# Patient Record
Sex: Female | Born: 1970 | Race: White | Hispanic: No | Marital: Married | Smoking: Never smoker
Health system: Southern US, Community
[De-identification: ages and names within clinical notes are randomized; demographics above are authoritative.]

## PROBLEM LIST (undated history)

## (undated) DIAGNOSIS — R87619 Unspecified abnormal cytological findings in specimens from cervix uteri: Secondary | ICD-10-CM

## (undated) DIAGNOSIS — F419 Anxiety disorder, unspecified: Secondary | ICD-10-CM

## (undated) HISTORY — DX: Anxiety disorder, unspecified: F41.9

## (undated) HISTORY — PX: INTRAUTERINE DEVICE INSERTION: SHX323

## (undated) HISTORY — DX: Unspecified abnormal cytological findings in specimens from cervix uteri: R87.619

---

## 2006-08-22 ENCOUNTER — Emergency Department (HOSPITAL_COMMUNITY): Admission: EM | Admit: 2006-08-22 | Discharge: 2006-08-22 | Payer: Self-pay | Admitting: Family Medicine

## 2006-09-06 ENCOUNTER — Emergency Department (HOSPITAL_COMMUNITY): Admission: EM | Admit: 2006-09-06 | Discharge: 2006-09-06 | Payer: Self-pay | Admitting: Emergency Medicine

## 2006-10-17 ENCOUNTER — Ambulatory Visit (HOSPITAL_COMMUNITY): Admission: RE | Admit: 2006-10-17 | Discharge: 2006-10-17 | Payer: Self-pay | Admitting: Internal Medicine

## 2006-11-27 ENCOUNTER — Emergency Department (HOSPITAL_COMMUNITY): Admission: EM | Admit: 2006-11-27 | Discharge: 2006-11-27 | Payer: Self-pay | Admitting: Family Medicine

## 2008-04-30 ENCOUNTER — Emergency Department (HOSPITAL_COMMUNITY): Admission: EM | Admit: 2008-04-30 | Discharge: 2008-04-30 | Payer: Self-pay | Admitting: Family Medicine

## 2009-02-10 ENCOUNTER — Emergency Department (HOSPITAL_COMMUNITY): Admission: EM | Admit: 2009-02-10 | Discharge: 2009-02-10 | Payer: Self-pay | Admitting: Family Medicine

## 2009-08-12 ENCOUNTER — Ambulatory Visit (HOSPITAL_COMMUNITY): Admission: RE | Admit: 2009-08-12 | Discharge: 2009-08-12 | Payer: Self-pay | Admitting: Internal Medicine

## 2010-02-25 ENCOUNTER — Ambulatory Visit (HOSPITAL_COMMUNITY): Admission: RE | Admit: 2010-02-25 | Discharge: 2010-02-25 | Payer: Self-pay | Admitting: Internal Medicine

## 2010-03-04 ENCOUNTER — Encounter: Admission: RE | Admit: 2010-03-04 | Discharge: 2010-03-04 | Payer: Self-pay | Admitting: Specialist

## 2010-08-16 ENCOUNTER — Encounter: Admission: RE | Admit: 2010-08-16 | Discharge: 2010-08-16 | Payer: Self-pay | Admitting: Specialist

## 2011-01-02 ENCOUNTER — Encounter: Payer: Self-pay | Admitting: Internal Medicine

## 2011-03-29 ENCOUNTER — Other Ambulatory Visit: Payer: Self-pay | Admitting: Specialist

## 2011-03-29 DIAGNOSIS — Z09 Encounter for follow-up examination after completed treatment for conditions other than malignant neoplasm: Secondary | ICD-10-CM

## 2011-04-05 ENCOUNTER — Ambulatory Visit
Admission: RE | Admit: 2011-04-05 | Discharge: 2011-04-05 | Disposition: A | Discharge status: Home / Self Care | Payer: 59 | Source: Ambulatory Visit | Attending: Specialist | Admitting: Specialist

## 2011-04-05 DIAGNOSIS — Z09 Encounter for follow-up examination after completed treatment for conditions other than malignant neoplasm: Secondary | ICD-10-CM

## 2012-04-03 ENCOUNTER — Other Ambulatory Visit (HOSPITAL_COMMUNITY): Payer: Self-pay | Admitting: Specialist

## 2012-04-03 DIAGNOSIS — Z1231 Encounter for screening mammogram for malignant neoplasm of breast: Secondary | ICD-10-CM

## 2012-05-08 ENCOUNTER — Ambulatory Visit (HOSPITAL_COMMUNITY)
Admission: RE | Admit: 2012-05-08 | Discharge: 2012-05-08 | Disposition: A | Discharge status: Home / Self Care | Payer: 59 | Source: Ambulatory Visit | Attending: Specialist | Admitting: Specialist

## 2012-05-08 DIAGNOSIS — Z1231 Encounter for screening mammogram for malignant neoplasm of breast: Secondary | ICD-10-CM | POA: Insufficient documentation

## 2012-06-21 ENCOUNTER — Ambulatory Visit (INDEPENDENT_AMBULATORY_CARE_PROVIDER_SITE_OTHER): Payer: 59 | Admitting: Family Medicine

## 2012-06-21 ENCOUNTER — Encounter: Payer: Self-pay | Admitting: Family Medicine

## 2012-06-21 VITALS — BP 112/69 | HR 64 | Temp 97.5°F | Ht 68.0 in | Wt 176.0 lb

## 2012-06-21 DIAGNOSIS — S8990XA Unspecified injury of unspecified lower leg, initial encounter: Secondary | ICD-10-CM

## 2012-06-21 DIAGNOSIS — S99929A Unspecified injury of unspecified foot, initial encounter: Secondary | ICD-10-CM

## 2012-06-21 DIAGNOSIS — S99912A Unspecified injury of left ankle, initial encounter: Secondary | ICD-10-CM

## 2012-06-21 NOTE — Patient Instructions (Addendum)
You have an ankle sprain. Ice the area for 15 minutes at a time, 3-4 times a day Take aleve 1-2 tabs twice a day with food for 1 week then as needed. Elevate above the level of your heart when possible ACE wrap if needed for compression, help with swelling. Strengthening exercises 3 sets of 10 most days of the week for up to 6 weeks. If not improving as expected, we may repeat x-rays or consider further testing like an MRI. Follow up with me as needed.

## 2012-06-24 ENCOUNTER — Encounter: Payer: Self-pay | Admitting: Family Medicine

## 2012-06-24 DIAGNOSIS — S99912A Unspecified injury of left ankle, initial encounter: Secondary | ICD-10-CM | POA: Insufficient documentation

## 2012-06-24 NOTE — Progress Notes (Signed)
  Subjective:    Patient ID: Caroline Roberts, female    DOB: 16-Apr-1971, 41 y.o.   MRN: [id number]  PCP: None listed  HPI 41 yo F here for left ankle injury  Patient reports on 7/10 while walking her dog the dog jumped to the side causing her to trip and fall down. She believes she inverted her left ankle with this injury but happened quickly so not sure - just knows left ankle was hurting after fall. Able to bear weight on this - lateral pain primarily though had some achilles, medial pain as well. Has been icing, elevating, compression wrap, taking motrin. Had a slight limp - improved a great deal from date of injury. Remote history of ankle sprains to both ankles.  History reviewed. No pertinent past medical history.  No current outpatient prescriptions on file prior to visit.    History reviewed. No pertinent past surgical history.  No Known Allergies  History   Social History  . Marital Status: Married    Spouse Name: N/A    Number of Children: N/A  . Years of Education: N/A   Occupational History  . Not on file.   Social History Main Topics  . Smoking status: Never Smoker   . Smokeless tobacco: Not on file  . Alcohol Use: Not on file  . Drug Use: Not on file  . Sexually Active: Not on file   Other Topics Concern  . Not on file   Social History Narrative  . No narrative on file    Family History  Problem Relation Age of Onset  . Hypertension Mother   . Diabetes Mother   . Hypertension Father     BP 112/69  Pulse 64  Temp 97.5 F (36.4 C) (Oral)  Ht 5\' 8"  (1.727 m)  Wt 176 lb (79.833 kg)  BMI 26.76 kg/m2 Review of Systems See HPI above.    Objective:   Physical Exam Gen: NAD  L ankle: No gross deformity, mild swelling, no ecchymoses FROM with 5/5 strength all directions. TTP over ATFL.  No malleolar, base 5th, achilles, navicular, fibular head TTP. 1+ talar tilt.  Trace ant drawer. Negative syndesmotic compression. Thompsons test  negative. NV intact distally.     Assessment & Plan:  1. Left ankle injury - 2/2 ankle sprain - much improved at this point.  Shown home strengthening and balance exercises and handout provided.  Ice, elevation, compression as needed.  Activities as tolerated.  See instructions for further.

## 2012-06-24 NOTE — Assessment & Plan Note (Signed)
2/2 ankle sprain - much improved at this point.  Shown home strengthening and balance exercises and handout provided.  Ice, elevation, compression as needed.  Activities as tolerated.  See instructions for further.

## 2015-08-30 ENCOUNTER — Ambulatory Visit (INDEPENDENT_AMBULATORY_CARE_PROVIDER_SITE_OTHER): Payer: BLUE CROSS/BLUE SHIELD | Admitting: Obstetrics and Gynecology

## 2015-08-30 ENCOUNTER — Encounter: Payer: Self-pay | Admitting: Obstetrics and Gynecology

## 2015-08-30 VITALS — BP 130/74 | HR 60 | Resp 16 | Ht 68.25 in | Wt 185.0 lb

## 2015-08-30 DIAGNOSIS — Z308 Encounter for other contraceptive management: Secondary | ICD-10-CM | POA: Diagnosis not present

## 2015-08-30 DIAGNOSIS — Z01419 Encounter for gynecological examination (general) (routine) without abnormal findings: Secondary | ICD-10-CM

## 2015-08-30 MED ORDER — ACYCLOVIR 200 MG PO CAPS: ORAL_CAPSULE | ORAL | Status: DC

## 2015-08-30 NOTE — Progress Notes (Signed)
Patient ID: Caroline Roberts, female   DOB: [birth year], 44 y.o.   MRN: [id number] 44 y.o. G56P2002 Married Caucasian female here for annual exam.   Wants a new Mirena IUD.  Has her second one alerady.   Wants Acyclovir for oral acyclovir.  Wants 200 mg dosage.  Takes 800 mg every 12 hours.  Takes for 24 hours.   Patient's son has regional pain syndrome following an injury. Seeing a specialist in [place]. Some stress in marriage. Working for [employer]. WHNP.  PCP:   Everrett Coombe, MD  No LMP recorded. Patient is not currently having periods (Reason: IUD).          Sexually active: Yes.  husband  The current method of family planning is none.--has Expired Mirena at this time.    Exercising: Yes.    running and walking. Smoker:  no  Health Maintenance: Pap:  2013 Neg:no HR HPV done History of abnormal Pap:  Yes, in her 20's had colposcopy for abnormal pap which showed CIN I but no treatment to cervix. MMG:  07/2015 normal:[facility] per patient. Colonoscopy:  n/a BMD:   n/a  Result  n/a TDaP:  2013   Screening Labs:  Hb today: PCP, Urine today: unable to void   reports that she has never smoked. She does not have any smokeless tobacco history on file. She reports that she does not drink alcohol or use illicit drugs.  Past Medical History  Diagnosis Date  . Anxiety   . 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 No pertinent past surgical history.  Current Outpatient Prescriptions  Medication Sig Dispense Refill  . levonorgestrel (MIRENA) 20 MCG/24HR IUD 1 each by Intrauterine route once.     No current facility-administered medications for this visit.    Family History  Problem Relation Age of Onset  . Hypertension Mother   . Diabetes Mother   . Hypertension Father   . Melanoma Father   . Breast cancer Paternal Grandmother 65  . Hypertension Paternal Grandmother   .  Diabetes Maternal Grandmother   . Hypertension Maternal Grandmother   . Stroke Maternal Grandmother   . Diabetes Maternal Grandfather   . Hypertension Maternal Grandfather   . Stroke Maternal Grandfather   . Hypertension Paternal Grandfather     ROS:  Pertinent items are noted in HPI.  Otherwise, a comprehensive ROS was negative.  Exam:   BP 130/74 mmHg  Pulse 60  Resp 16  Ht 5' 8.25" (1.734 m)  Wt 185 lb (83.915 kg)  BMI 27.91 kg/m2    General appearance: alert, cooperative and appears stated age Head: Normocephalic, without obvious abnormality, atraumatic Neck: no adenopathy, supple, symmetrical, trachea midline and thyroid normal to inspection and palpation Lungs: clear to auscultation bilaterally Breasts: normal appearance, no masses or tenderness, Inspection negative, No nipple retraction or dimpling, No nipple discharge or bleeding, No axillary or supraclavicular adenopathy Heart: regular rate and rhythm Abdomen: soft, non-tender; bowel sounds normal; no masses,  no organomegaly Extremities: extremities normal, atraumatic, no cyanosis or edema Skin: Skin color, texture, turgor normal. No rashes or lesions Lymph nodes: Cervical, supraclavicular, and axillary nodes normal. No abnormal inguinal nodes palpated Neurologic: Grossly normal  Pelvic: External genitalia:  no lesions              Urethra:  normal appearing urethra with no masses, tenderness or lesions  Bartholins and Skenes: normal                 Vagina: normal appearing vagina with normal color and discharge, no lesions              Cervix: no lesions.  IUD strings seen.               Pap taken: Yes.   Bimanual Exam:  Uterus:  normal size, contour, position, consistency, mobility, non-tender              Adnexa: normal adnexa and no mass, fullness, tenderness              Rectovaginal: Yes.  .  Confirms.              Anus:  normal sphincter tone, no lesions  Chaperone was present for  exam.  Assessment:   Well woman visit with normal exam. Expired Mirena IUD.  HSV.  Uses Acyclovir.  Hx CIN I.  Situational stress.   Plan: Yearly mammogram recommended after age 72.  Recommended self breast exam.  Pap and HR HPV as above. Recommendations for Calcium, Vitamin D, regular exercise program including cardiovascular and weight bearing exercise. Labs performed.  No..   See orders. Refills given on medications.  Yes.  .  See orders.  Acyclovir.  Return for IUD removal and reinsertion of Mirena.   Will use paracervical block.  Has done counseling.  Declines this.  Declines medical therapy at this time.  Follow up annually and prn.     After visit summary provided.

## 2015-08-30 NOTE — Patient Instructions (Signed)

## 2015-09-01 LAB — IPS PAP TEST WITH HPV

## 2015-09-22 ENCOUNTER — Telehealth: Payer: Self-pay | Admitting: Obstetrics and Gynecology

## 2015-09-22 NOTE — Telephone Encounter (Signed)
Patient is calling to check the status of her iud precert. Patient did speak  with her insurance. Patient was last seen 08/30/15. Patient said you can leave details on her voicemail. Patient is aware that Kriste BasqueBecky is out of the office this afternoon.

## 2015-09-23 NOTE — Telephone Encounter (Signed)
Return call to patient regarding scheduling.  Appointment scheduled for Monday, 09/27/2015 at 10 AM. Instructed to take Motrin 800 mg 1 hour prior to appointment with food.  Routing to provider for final review. Patient agreeable to disposition. Will close encounter.

## 2015-09-23 NOTE — Telephone Encounter (Signed)
Patient aware of benefit and ready to schedule iud removal and reinsertion. Agreeable to call from nurse to schedule.

## 2015-09-27 ENCOUNTER — Ambulatory Visit (INDEPENDENT_AMBULATORY_CARE_PROVIDER_SITE_OTHER): Payer: BLUE CROSS/BLUE SHIELD | Admitting: Obstetrics and Gynecology

## 2015-09-27 ENCOUNTER — Encounter: Payer: Self-pay | Admitting: Obstetrics and Gynecology

## 2015-09-27 VITALS — BP 118/74 | HR 56 | Ht 68.25 in | Wt 184.6 lb

## 2015-09-27 DIAGNOSIS — Z30433 Encounter for removal and reinsertion of intrauterine contraceptive device: Secondary | ICD-10-CM | POA: Diagnosis not present

## 2015-09-27 DIAGNOSIS — Z308 Encounter for other contraceptive management: Secondary | ICD-10-CM

## 2015-09-27 LAB — POCT URINE PREGNANCY: PREG TEST UR: NEGATIVE

## 2015-09-27 NOTE — Progress Notes (Signed)
Patient ID: Caroline Roberts, female   DOB: 05/11/1971, 44 y.o.   MRN: [id number] GYNECOLOGY  VISIT   HPI: 44 y.o.   Married  Caucasian  female   G2P2002 with Patient's last menstrual period was 08/10/2015 (approximate).   here for  Mirena IUD removal and re-insertion.  IUD expired on August 18.  Has abstained since last office visit.   Twisted her knee getting things out of the attic.  UPT negative today here.  GYNECOLOGIC HISTORY: Patient's last menstrual period was 08/10/2015 (approximate). Contraception:None Menopausal hormone therapy: n/a Last mammogram: 07/2015 normal:[facility] per patient. Last pap smear: 08-30-15 Neg:Neg HR HHPV        OB History    Gravida Para Term Preterm AB TAB SAB Ectopic Multiple Living   Patient Active Problem List   Diagnosis Date Noted  . Left ankle injury 06/24/2012    Past Medical History  Diagnosis Date  . Anxiety   . Abnormal Pap smear of cervix     --in her 20's had colposcopy--CIN I--no treatment    No past surgical history on file.  Current Outpatient Prescriptions  Medication Sig Dispense Refill  . acyclovir (ZOVIRAX) 200 MG capsule Take 4 capsules twice a day for up to 5 days. 30 capsule 2  . levonorgestrel (MIRENA) 20 MCG/24HR IUD 1 each by Intrauterine route once.     No current facility-administered medications for this visit.     ALLERGIES: Review of patient's allergies indicates no known allergies.  Family History  Problem Relation Age of Onset  . Hypertension Mother   . Diabetes Mother   . Hypertension Father   . Melanoma Father   . Breast cancer Paternal Grandmother 39  . Hypertension Paternal Grandmother   . Diabetes Maternal Grandmother   . Hypertension Maternal Grandmother   . Stroke Maternal Grandmother   . Diabetes Maternal Grandfather   . Hypertension Maternal Grandfather   . Stroke Maternal Grandfather   . Hypertension Paternal Grandfather     Social History    Social History  . Marital Status: Married    Spouse Name: N/A  . Number of Children: N/A  . Years of Education: N/A   Occupational History  . Not on file.   Social History Main Topics  . Smoking status: Never Smoker   . Smokeless tobacco: Not on file  . Alcohol Use: No  . Drug Use: No  . Sexual Activity:    Partners: Male    Birth Control/ Protection: None     Comment: expired Mirena 07/2015   Other Topics Concern  . Not on file   Social History Narrative    ROS:  Pertinent items are noted in HPI.  PHYSICAL EXAMINATION:    BP 118/74 mmHg  Pulse 56  Ht 5' 8.25" (1.734 m)  Wt 184 lb 9.6 oz (83.734 kg)  BMI 27.85 kg/m2  LMP 08/10/2015 (Approximate)    General appearance: alert, cooperative and appears stated age   Pelvic: External genitalia:  no lesions              Urethra:  normal appearing urethra with no masses, tenderness or lesions              Bartholins and Skenes: normal                 Vagina: normal appearing vagina with normal color and discharge,  no lesions              Cervix:  IUD strings seen.              Bimanual Exam:  Uterus:  normal size, contour, position, consistency, mobility, non-tender              Adnexa: normal adnexa and no mass, fullness, tenderness        Mirena IUD removal and reinsertion.  IUD Log number [id number], expiration 02/19.  Speculum placed.  Hibiclens prep.   Paracervical block with 10 cc 1% lidocaine - lot [id number], expiration 12/12/15. Old IUD removed - intact, and discarded.  Uterus sounded to 8.5 cm after os finder used.  Some internal os cervical stenosis.  Mirena IUD placed.  Strings trimmed.  No complications. Minimal EBL.   Chaperone was present for exam.  ASSESSMENT  Mirena IUD removal and placement of new Mirena IUD.   PLAN  Mirena IUD brochure and IUD card to patient.  Patient will return for bleeding, pain or any other concern.    Prefers not to schedule a follow up visit.  She is a Set designerWomen's Health  Nurse Practitioner.   After patient went to the waiting room to check out, she indicated she was having cramping.   I did have her return to an exam room to rest on an exam table.  After a brief time, she then felt well enough to leave the office and the nurse discharged her.    An After Visit Summary was printed and given to the patient.

## 2015-09-28 ENCOUNTER — Ambulatory Visit (INDEPENDENT_AMBULATORY_CARE_PROVIDER_SITE_OTHER): Payer: BLUE CROSS/BLUE SHIELD | Admitting: Obstetrics and Gynecology

## 2015-09-28 ENCOUNTER — Ambulatory Visit (INDEPENDENT_AMBULATORY_CARE_PROVIDER_SITE_OTHER): Payer: BLUE CROSS/BLUE SHIELD

## 2015-09-28 ENCOUNTER — Encounter: Payer: Self-pay | Admitting: Obstetrics and Gynecology

## 2015-09-28 ENCOUNTER — Telehealth: Payer: Self-pay | Admitting: Obstetrics and Gynecology

## 2015-09-28 VITALS — BP 112/70 | HR 58 | Temp 98.3°F | Resp 14 | Wt 185.0 lb

## 2015-09-28 DIAGNOSIS — Z30431 Encounter for routine checking of intrauterine contraceptive device: Secondary | ICD-10-CM | POA: Diagnosis not present

## 2015-09-28 DIAGNOSIS — R1032 Left lower quadrant pain: Secondary | ICD-10-CM | POA: Diagnosis not present

## 2015-09-28 NOTE — Telephone Encounter (Signed)
Patient had iud inserted on yesterday and is having a lot of cramping.

## 2015-09-28 NOTE — Patient Instructions (Signed)

## 2015-09-28 NOTE — Telephone Encounter (Signed)
Spoke with patient. Patient had her Mirena removed and replaced yesterday 10/17. States she was having a lot of cramping that was not relieved with alternating motrin and ibuprofen over night. Cramping has resolved. Now has a constant pain on her left side. Patient is concerned as this pain woke her up last night and has not gone away throughout the day. Pain is 5/10. "Dr.Silva told me the IUD she removed was a little embedded and I am not sure if the pain is coming from that or not. I may just worrying myself, but something is not right." Advised Dr.Silva is out of the office today, but that I will speak with the covering provider and return call as she will likely need to be seen and may need a ultrasound. Patient is agreeable.

## 2015-09-28 NOTE — Telephone Encounter (Signed)
Spoke with patient. Advised patient she will need to be seen in office today for further evaluation. Patient is agreeable. Advised she will need to come now to office. Patient is agreeable and can be here by 4 pm. Okay per Dr.Jertson.  Routing to provider for final review. Patient agreeable to disposition. Will close encounter.

## 2015-09-28 NOTE — Telephone Encounter (Signed)
Left message to call Haydan Mansouri at [phone]. 

## 2015-09-28 NOTE — Telephone Encounter (Signed)
Spoke with Dr.Jertson who recommends that patient come in today and she will likely need an ultrasound. Left message for patient to call Kaitlyn at [phone].

## 2015-09-28 NOTE — Progress Notes (Signed)
    S: the patient had a mirena IUD removed yesterday and a new one inserted. She was told her old IUD was embedded. She was crampy yesterday. Woke up at midnight with pain in her LLQ, the pain ranging from a 2-5/10 in severity today, constant, dull. Last motrin was this morning.   Review of Systems  Gastrointestinal: Positive for abdominal pain.       Left sided abdominal pain   All other systems reviewed and are negative.  Past Medical History  Diagnosis Date  . Anxiety   . Abnormal Pap smear of cervix     --in her 20's had colposcopy--CIN I--no treatment   No past surgical history on file.  O: General: alert white female in mild discomfort Blood pressure 112/70, pulse 58, temperature 98.3 F (36.8 C), resp. rate 14, weight 185 lb (83.915 kg), last menstrual period 08/10/2015. Abdomen: soft, not tender, no masses Pelvic: normal external genitalia, normal vaginal mucosa, cervix without lesions, IUD string 3-4 cm. BM: no CMT, uterus is anteverted, mobile, not tender, no adnexal masses or tenderness.   U/S: IUD in place  A/P  1) LLQ abdominal pain, 1 day s/p removal and reinsertion of a mirena IUD. Benign exam, afebrile, IUD in place on ultrasound  Recommend she take ibuprofen around the clock for the next 24-48 hours  Call with any concerns, or if she isn't feeling better  2) IUD check, IUD in place

## 2015-09-29 ENCOUNTER — Telehealth: Payer: Self-pay | Admitting: Obstetrics and Gynecology

## 2015-09-29 ENCOUNTER — Ambulatory Visit (INDEPENDENT_AMBULATORY_CARE_PROVIDER_SITE_OTHER): Payer: BLUE CROSS/BLUE SHIELD | Admitting: Obstetrics and Gynecology

## 2015-09-29 ENCOUNTER — Encounter: Payer: Self-pay | Admitting: Obstetrics and Gynecology

## 2015-09-29 VITALS — BP 120/72 | HR 56 | Ht 68.25 in | Wt 185.0 lb

## 2015-09-29 DIAGNOSIS — Z30432 Encounter for removal of intrauterine contraceptive device: Secondary | ICD-10-CM

## 2015-09-29 NOTE — Telephone Encounter (Signed)
Spoke with patient. Advised patient Dr.Silva can see her today at 1 pm for IUD removal. Per Dr.Silva if she would like to try pain medication to see if that will help relieve her discomfort we can try that first. Patient states "I am conflicted because I want to try to wait it out, but I just feel like something is not right. I do not want to try pain medication. I think I will just come in to have it removed." Appointment scheduled for today at 1 pm with Dr.Silva. Patient is agreeable.  Routing to provider for final review.

## 2015-09-29 NOTE — Progress Notes (Signed)
Patient ID: Caroline Roberts, female   DOB: [birth year], 44 y.o.   MRN: [id number] GYNECOLOGY  VISIT   HPI: 44 y.o.   Married  Caucasian  female   G2P2002 with Patient's last menstrual period was 08/10/2015 (approximate).   here for Mirena IUD removal.  Patient with LLQ discomfort since Mirena inserted on 09-27-15 and wishes to have it removed today. Feels better to press on the left side.  Is not having cramping.  Feels like pain in left lower side. Cannot sleep.   Needs to able to take care of her son.   Had pelvic ultrasound 09/28/15 showing IUD in endometrial canal.  Ovaries normal.   Had one clot after IUD placed.   GYNECOLOGIC HISTORY: Patient's last menstrual period was 08/10/2015 (approximate). Contraception:Mirena IUD inserted 09-27-15 Menopausal hormone therapy: n/a Last mammogram: 07/2015 normal per patient:[facility]. Last pap smear: 08-30-15 Neg:Neg HR HPV        OB History    Gravida Para Term Preterm AB TAB SAB Ectopic Multiple Living   2 2 2       2          Patient Active Problem List   Diagnosis Date Noted  . Left ankle injury 06/24/2012    Past Medical History  Diagnosis Date  . Anxiety   . Abnormal Pap smear of cervix     --in her 20's had colposcopy--CIN I--no treatment    No past surgical history on file.  Current Outpatient Prescriptions  Medication Sig Dispense Refill  . acyclovir (ZOVIRAX) 200 MG capsule Take 4 capsules twice a day for up to 5 days. 30 capsule 2  . levonorgestrel (MIRENA) 20 MCG/24HR IUD 1 each by Intrauterine route once.     No current facility-administered medications for this visit.     ALLERGIES: Review of patient's allergies indicates no known allergies.  Family History  Problem Relation Age of Onset  . Hypertension Mother   . Diabetes Mother   . Hypertension Father   . Melanoma Father   . Breast cancer Paternal Grandmother [id number]  . Hypertension Paternal Grandmother   . Diabetes Maternal Grandmother   .  Hypertension Maternal Grandmother   . Stroke Maternal Grandmother   . Diabetes Maternal Grandfather   . Hypertension Maternal Grandfather   . Stroke Maternal Grandfather   . Hypertension Paternal Grandfather     Social History   Social History  . Marital Status: Married    Spouse Name: N/A  . Number of Children: N/A  . Years of Education: N/A   Occupational History  . Not on file.   Social History Main Topics  . Smoking status: Never Smoker   . Smokeless tobacco: Not on file  . Alcohol Use: No  . Drug Use: No  . Sexual Activity:    Partners: Male    Birth Control/ Protection: None     Comment: expired Mirena 07/2015   Other Topics Concern  . Not on file   Social History Narrative    ROS:  Pertinent items are noted in HPI.  PHYSICAL EXAMINATION:    BP 120/72 mmHg  Pulse 56  Ht 5' 8.25" (1.734 m)  Wt 185 lb (83.915 kg)  BMI 27.91 kg/m2  LMP 08/10/2015 (Approximate)    General appearance: alert, cooperative and appears stated age Abdomen: soft, non-tender; bowel sounds normal; no masses,  no organomegaly   Pelvic: External genitalia:  no lesions  Urethra:  normal appearing urethra with no masses, tenderness or lesions              Bartholins and Skenes: normal                 Vagina: normal appearing vagina with normal color and discharge, no lesions              Cervix: no lesions and IUD strings seen.  IUD removed with ring forceps after verbal consent for IUD removal.   Feels like there is internal os stenosis - some resistance with removal of the IUD as it passes through endocervix.            Bimanual Exam:  Uterus:  normal size, contour, position, consistency, mobility, non-tender              Adnexa: normal adnexa and no mass, fullness, tenderness              Chaperone was present for exam.  ASSESSMENT  Pelvic pain.  Mirena IUD removal.   PLAN  I told patient I was sorry that she was having pain.  Patient declines pain medication.   She declines contraception at this time.  Will call if she changes her mind.  Follow up prn.    An After Visit Summary was printed and given to the patient.

## 2015-09-29 NOTE — Telephone Encounter (Signed)
Patient wants to have IUD removed her IUD has been causing her constant pain. Best contact # for patient [phone]

## 2015-09-29 NOTE — Telephone Encounter (Signed)
Thank you for the note.  I will remove the IUD for the patient if she would like.  Encounter is closed.

## 2015-09-29 NOTE — Telephone Encounter (Signed)
Spoke with patient. Patient had her Mirena removed and replaced on 09/27/2015 with Dr.Silva. Was seen yesterday 09/28/2015 with Dr.Jertson for evaluation and PUS for LLQ pain. IUD was in correct position. Patient calling today stating that with alternating motrin and tylenol she is still experiencing pain of 5/10. "I thought it would subside, but it is not going away." Denies any fevers, chills, or bleeding. Patient is requesting to come in to have IUD removed. Is out of work today due to lack of sleep from pain over night. Advised I will speak with Dr.Silva about her symptoms and removal and return call. Patient is agreeable and is available today to come in.

## 2015-09-29 NOTE — Addendum Note (Signed)
Addended by: Alphonsa OverallIXON, AMANDA L on: 09/29/2015 12:51 PM   Modules accepted: Orders

## 2015-11-17 ENCOUNTER — Telehealth: Payer: Self-pay | Admitting: Obstetrics and Gynecology

## 2015-11-17 NOTE — Telephone Encounter (Signed)
Patient called and said, "I've had some trouble with the Mirena device previously and don't have one right now. 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

## 2015-11-17 NOTE — Telephone Encounter (Signed)
Spoke with patient. 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 "I think I want to give it another try. The rep told me that they are able to send me a replacement IUD so that it will not have to go through my insurance." Patient states the representative's name was Victorino DikeJennifer and she is going to contact her regarding obtaining a "replacement" IUD once she is scheduled. Mirena IUD was removed 09/29/2015 with Dr.Silva due to increased pain and discomfort. Patient states that she had a cycle after removal but has not had a cycle since. Advised patient I will need to speak with Dr.Silva regarding recommendations on insertion as this has to be done with her cycle. Patient is agreeable.

## 2015-11-18 MED ORDER — MISOPROSTOL 200 MCG PO TABS: ORAL_TABLET | ORAL | Status: DC

## 2015-11-18 NOTE — Telephone Encounter (Signed)
Per ROI, ok to leave detailed message on cell number and VM confirms this is "Tresa EndoKelly." Reviewed with Dr Edward JollySilva directly regarding insertion for tomorrow if patient on cycle. She may take Cytotec orally or vaginally, whichever she is more comfortable with.  Left message with Dr Rica RecordsSilva's recommendation for Cytotec in effort to facilitate successful procedure.  Will proceed with sending to pharmacy on file in effort to plan for insertion tomorrow if patient desires. Advised to call in am.

## 2015-11-18 NOTE — Telephone Encounter (Signed)
Call to Mirena rep Chalmers CaterCaroline Roberts [phone]. Replacement IUD will require MD signature first and then be shipped to office. Rep will be by office today or tomorrow for signature.

## 2015-11-18 NOTE — Telephone Encounter (Signed)
Call to patient. Update in IUD replacement provided. Patient using condoms for contraception and menses starting now. Spotting today. Has some concerns about Cytotec. Guideline changes and risk of infection. Discussed benefits of easing dilation for insertion with stenotic cervix but can certainly review with provider.  Will also check on recommendation for insertion date since spotting has started today. Routing to Dr Edward JollySilva for recommendation.

## 2015-11-18 NOTE — Telephone Encounter (Signed)
I am happy to place another Mirena IUD.   I would suggest pretreatment with Cytotec 200 mcg pv the hs prior to procedure and Cytotec 200 mcg pv the am of procedure.  Please send to pharmacy of choice. I would also do a paracervical block for her again.    We will need to do the precert process again for the IUD.  There will be at least a fee for placement of the IUD. We will also need to coordinate with the MIrena rep so the IUD can be delivered here or is the [place] company going to reimburse her?  Please have patient do a urine pregnancy test now if she has not already done this.  Her IUD can be placed if she has not had a cycle and has a negative serum hCG two days prior to placement of IUD and no intercourse for 2 weeks before this........Marland Kitchen.or just place the IUD with the next cycle.

## 2015-11-18 NOTE — Telephone Encounter (Signed)
Call to patient. Left message to call back.  

## 2015-11-18 NOTE — Telephone Encounter (Signed)
Call to patient, left message to call back. 

## 2015-11-18 NOTE — Telephone Encounter (Signed)
I welcome patient to take the Cytotec orally instead of vaginally.   I needed to use the os finder for the insertion that last time, and I would like to do anything I can to make this more comfortable and successful for her!

## 2015-11-19 ENCOUNTER — Ambulatory Visit (INDEPENDENT_AMBULATORY_CARE_PROVIDER_SITE_OTHER): Payer: BLUE CROSS/BLUE SHIELD | Admitting: Obstetrics and Gynecology

## 2015-11-19 ENCOUNTER — Encounter: Payer: Self-pay | Admitting: Obstetrics and Gynecology

## 2015-11-19 VITALS — BP 118/70 | HR 60 | Ht 68.25 in | Wt 187.0 lb

## 2015-11-19 DIAGNOSIS — Z30430 Encounter for insertion of intrauterine contraceptive device: Secondary | ICD-10-CM

## 2015-11-19 LAB — POCT URINE PREGNANCY: Preg Test, Ur: NEGATIVE

## 2015-11-19 NOTE — Telephone Encounter (Signed)
Return call to patient. Left message to call back. Can also speak to triage nurse.

## 2015-11-19 NOTE — Telephone Encounter (Signed)
Routing to provider for final review. Patient agreeable to disposition. Will close encounter.     

## 2015-11-19 NOTE — Telephone Encounter (Signed)
Rx for Cytotec was signed by me just now, so I am not certain the patient will have time to get Rx. It is OK if she does not take it for the insertion.

## 2015-11-19 NOTE — Telephone Encounter (Signed)
Patient returned call

## 2015-11-19 NOTE — Telephone Encounter (Signed)
Spoke with patient. Advised of message as seen below from Dr.Silva. Patient is agreeable. Patient states that she started her cycle on 11/18/2015 and would like to proceed with IUD insertion today. Appointment scheduled for today 11/19/2015 at 1 pm with Dr.Silva. Patient is agreeable to date and time. Pre procedure instructions given.  Motrin instructions given. Motrin=Advil=Ibuprofen, 800 mg one hour before appointment. Eat a meal and hydrate well before appointment. Patient does not wish to take Cytotec at this time for the insertion. Advised this has been sent to the pharmacy if she decides she would like to take this this morning.

## 2015-11-19 NOTE — Patient Instructions (Signed)

## 2015-11-19 NOTE — Progress Notes (Signed)
Patient ID: Caroline Roberts, female   DOB: [birth year], 44 y.o.   MRN: [id number] GYNECOLOGY  VISIT   HPI: 44 y.o.   Married  Caucasian  female   G2P2002 with Patient's last menstrual period was 11/18/2015 (exact date).   here for Mirena IUD Insertion.   Last Mirena IUD was removed soon after placement due to pain.  Ultrasound confirmed proper placement.   Patient did not take Cytotec.  Urine UPT: negative.  GYNECOLOGIC HISTORY: Patient's last menstrual period was 11/18/2015 (exact date). Contraception:condoms Menopausal hormone therapy: n/a Last mammogram: 07/2015 density cat.C/Neg/BiRads1:[facility] Last pap smear: 08-30-15 Neg:Neg HR HPV        OB History    Gravida Para Term Preterm AB TAB SAB Ectopic Multiple Living   2 2 2       2          Patient Active Problem List   Diagnosis Date Noted  . Left ankle injury 06/24/2012    Past Medical History  Diagnosis Date  . Anxiety   . 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 No pertinent past surgical history.  Current Outpatient Prescriptions  Medication Sig Dispense Refill  . acyclovir (ZOVIRAX) 200 MG capsule Take 4 capsules twice a day for up to 5 days. 30 capsule 2   No current facility-administered medications for this visit.     ALLERGIES: Review of patient's allergies indicates no known allergies.  Family History  Problem Relation Age of Onset  . Hypertension Mother   . Diabetes Mother   . Hypertension Father   . Melanoma Father   . Breast cancer Paternal Grandmother [id number]  . Hypertension Paternal Grandmother   . Diabetes Maternal Grandmother   . Hypertension Maternal Grandmother   . Stroke Maternal Grandmother   . Diabetes Maternal Grandfather   . Hypertension Maternal Grandfather   . Stroke Maternal Grandfather   . Hypertension Paternal Grandfather     Social History   Social History  . Marital Status: Married    Spouse Name: N/A   . Number of Children: N/A  . Years of Education: N/A   Occupational History  . Not on file.   Social History Main Topics  . Smoking status: Never Smoker   . Smokeless tobacco: Not on file  . Alcohol Use: No  . Drug Use: No  . Sexual Activity:    Partners: Male    Birth Control/ Protection: Condom   Other Topics Concern  . Not on file   Social History Narrative    ROS:  Pertinent items are noted in HPI.  PHYSICAL EXAMINATION:    BP 118/70 mmHg  Pulse 60  Ht 5' 8.25" (1.734 m)  Wt 187 lb (84.823 kg)  BMI 28.21 kg/m2  LMP 11/18/2015 (Exact Date)    General appearance: alert, cooperative and appears stated age   Pelvic: External genitalia:  no lesions              Urethra:  normal appearing urethra with no masses, tenderness or lesions              Bartholins and Skenes: normal                 Vagina: normal appearing vagina with normal color and discharge, no lesions              Cervix: no lesions and menstrual blood noted.  Bimanual Exam:  Uterus:  normal size, contour, position, consistency, mobility, non-tender              Adnexa: normal adnexa and no mass, fullness, tenderness           Chaperone was present for exam.  Procedure - Mirena IUD placement. Lot number [id number], Expiration 06/19.  IUD was a free supply supplied directly by the representation.  Consent for procedure.  Speculum placed in vagina.  Hibiclens sterilization to cervix.  Tenaculum to anterior cervical lip.  Uterus sounded to 8.5 cm.  Mirena IUD placed without difficulty.  Strings trimmed.  No complications.  Minimal EBL. Tolerated well.    ASSESSMENT  Mirena IUD placement.   PLAN  Instructions and precautions given.  Follow up prn.  Prefers not to return for a routine IUD check.    An After Visit Summary was printed and given to the patient.

## 2016-11-08 ENCOUNTER — Encounter: Payer: Self-pay | Admitting: Obstetrics and Gynecology

## 2016-11-08 ENCOUNTER — Ambulatory Visit (INDEPENDENT_AMBULATORY_CARE_PROVIDER_SITE_OTHER): Payer: Managed Care, Other (non HMO) | Admitting: Obstetrics and Gynecology

## 2016-11-08 VITALS — BP 122/64 | HR 60 | Resp 18 | Ht 68.0 in | Wt 189.8 lb

## 2016-11-08 DIAGNOSIS — Z01419 Encounter for gynecological examination (general) (routine) without abnormal findings: Secondary | ICD-10-CM | POA: Diagnosis not present

## 2016-11-08 MED ORDER — ACYCLOVIR 200 MG PO CAPS: ORAL_CAPSULE | ORAL | 2 refills | Status: DC

## 2016-11-08 NOTE — Progress Notes (Signed)
45 y.o. 902P2002 Married Caucasian female here for annual exam.    Doing well with her Mirena IUD.  Spotting this month.   Sees dermatology yearly for skin checks.   Did blood work this year.  HDL 55, T cholesterol 160.  Gluclose was normal.   Son has neuropathy similar to RSD.  Did [place] intense in [place] in [place] and is now doing multiple therapies.  Much less stress now at home.  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  Happy with her colleague.  PCP:   Everrett Coombeody Matthews, MD  Patient's last menstrual period was 10/25/2016 (exact date).     Period Cycle (Days):  (not really having  cycles with Mirena IUD)     Sexually active: Yes.   female The current method of family planning is IUD--Mirena IUD inserted 11-19-15.    Exercising: Yes.    Walking, running/yoga and biking Smoker:  no  Health Maintenance: Pap:  08-30-15 Neg:Neg HR HPV History of abnormal Pap:  Yes, in her 20's had colposcopy for abnormal pap which showed CIN I but no treatment to cervix. MMG:  11/06/16 per patient at [facility].  Result pending.  Colonoscopy:  n/a BMD:   n/a  Result  n/a TDaP:  2013 Gardasil:   N/A Screening Labs:  Hb today: PCP, Urine today: Neg   reports that she has never smoked. She has never used smokeless tobacco. She reports that she drinks about 0.6 oz of alcohol per week . She reports that she does not use drugs.  Past Medical History:  Diagnosis Date  . 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 Anxiety     History reviewed. No pertinent surgical history.  Current Outpatient Prescriptions  Medication Sig Dispense Refill  . acyclovir (ZOVIRAX) 200 MG capsule Take 4 capsules twice a day for up to 5 days. 30 capsule 2  . levonorgestrel (MIRENA) 20 MCG/24HR IUD 1 each by Intrauterine route once.     No current facility-administered medications for this visit.     Family History  Problem Relation  Age of Onset  . Hypertension Mother   . Diabetes Mother   . Hypertension Father   . Melanoma Father   . Breast cancer Paternal Grandmother [phone]  . Hypertension Paternal Grandmother   . Diabetes Maternal Grandmother   . Hypertension Maternal Grandmother   . Stroke Maternal Grandmother   . Diabetes Maternal Grandfather   . Hypertension Maternal Grandfather   . Stroke Maternal Grandfather   . Hypertension Paternal Grandfather     ROS:  Pertinent items are noted in HPI.  Otherwise, a comprehensive ROS was negative.  Exam:   BP 122/64 (BP Location: Right Arm, Patient Position: Sitting, Cuff Size: Normal)   Pulse 60   Resp 18   Ht 5\' 8"  (1.727 m)   Wt 189 lb 12.8 oz (86.1 kg)   LMP 10/25/2016 (Exact Date)   BMI 28.86 kg/m     General appearance: alert, cooperative and appears stated age Head: Normocephalic, without obvious abnormality, atraumatic Neck: no adenopathy, supple, symmetrical, trachea midline and thyroid normal to inspection and palpation Lungs: clear to auscultation bilaterally Breasts: normal appearance, no masses or tenderness, No nipple retraction or dimpling, No nipple discharge or bleeding, No axillary or supraclavicular adenopathy Heart: regular rate and rhythm Abdomen: soft, non-tender; no masses, no organomegaly Extremities: extremities normal, atraumatic, no cyanosis or edema Skin: Skin  color, texture, turgor normal. No rashes or lesions Lymph nodes: Cervical, supraclavicular, and axillary nodes normal. No abnormal inguinal nodes palpated Neurologic: Grossly normal  Pelvic: External genitalia:  no lesions              Urethra:  normal appearing urethra with no masses, tenderness or lesions              Bartholins and Skenes: normal                 Vagina: normal appearing vagina with normal color and discharge, no lesions              Cervix: no lesions.  IUD strings seen.               Pap taken: No. Bimanual Exam:  Uterus:  normal size, contour, position,  consistency, mobility, non-tender              Adnexa: no mass, fullness, tenderness              Rectal exam: No..   Declines.        Chaperone was present for exam.  Assessment:   Well woman visit with normal exam. Mirena IUD.  HSV.  Uses Acyclovir.  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   Plan: Yearly mammogram recommended after age 46.  Recommended self breast exam.  Pap and HR HPV as above. Discussed Calcium, Vitamin D, regular exercise program including cardiovascular and weight bearing exercise. Refill of Acyclovir.  Follow up annually and prn.      After visit summary provided.

## 2016-11-08 NOTE — Patient Instructions (Signed)

## 2016-11-22 ENCOUNTER — Encounter: Payer: Self-pay | Admitting: Obstetrics and Gynecology

## 2017-01-11 ENCOUNTER — Telehealth: Payer: Self-pay | Admitting: Obstetrics and Gynecology

## 2017-01-11 NOTE — Telephone Encounter (Signed)
Spoke with patient. Patient states she has been having pain for a long time now and has been ignoring it. Patient states pain started in Nov 2017, that is what triggered her to schedule AEX 11/08/16. Patient states pain is left lower side next to hip and low in pelvis. Patient sates initially started intermittently with yoga and in mornings. Patient states pain is not sharp or strong. Patient states pain has been more continuous in past week, sometimes making lower left leg heavy. Patient states she has IUD in place, no regular cycles. Patient denies vaginal odor, discharge, urinary complaints, fever, nausea or vomiting. Patient states took UPT today, negative. Patient states she would like to schedule OV with Dr. Edward JollySilva. Patient states she can only come in on Wednesdays, patient scheduled for 01/24/17 at 1pm. Advised patient should symptoms worsen, fever or nausea/vomiting develop, return call to office or visit local ER. Patient verbalizes understanding and is agreeable.  Routing to provider for final review. Patient is agreeable to disposition. Will close encounter.

## 2017-01-11 NOTE — Telephone Encounter (Signed)
Patient is having some left side pelvic pain.

## 2017-01-24 ENCOUNTER — Ambulatory Visit: Payer: Self-pay | Admitting: Obstetrics and Gynecology

## 2017-04-11 ENCOUNTER — Telehealth: Payer: Self-pay | Admitting: Obstetrics and Gynecology

## 2017-04-11 NOTE — Telephone Encounter (Signed)
Left message to call Alazne Quant at [phone].  

## 2017-04-11 NOTE — Telephone Encounter (Signed)
Spoke with patient. Patient states she had PUS done on 02/26/17, ordered by pcp. Patient states she was advised she had a left ovarian cyst, f/u with GYN. Patient reports an ongoing, intermittent, nagging pelvic pain that she is getting tired of. Patient denies fever, nausea, vomiting or lower back pain. Patient request appointment for 5/9, scheduled for 10:30am with Dr. Edward Jolly. Advised patient to request copy of PUS be faxed to [phone]. Patient verbalizes understanding and is agreeable.  Routing to provider for final review. Patient is agreeable to disposition. Will close encounter.

## 2017-04-11 NOTE — Telephone Encounter (Signed)
Patient was seen at her primary doctor and was told she has a cyst. She was told to follow up with her Gynecologist.

## 2017-04-18 ENCOUNTER — Encounter: Payer: Self-pay | Admitting: Obstetrics and Gynecology

## 2017-04-18 ENCOUNTER — Ambulatory Visit (INDEPENDENT_AMBULATORY_CARE_PROVIDER_SITE_OTHER): Payer: Managed Care, Other (non HMO) | Admitting: Obstetrics and Gynecology

## 2017-04-18 VITALS — BP 102/68 | HR 68 | Resp 16 | Wt 191.0 lb

## 2017-04-18 DIAGNOSIS — N83202 Unspecified ovarian cyst, left side: Secondary | ICD-10-CM

## 2017-04-18 DIAGNOSIS — R1032 Left lower quadrant pain: Secondary | ICD-10-CM | POA: Diagnosis not present

## 2017-04-18 NOTE — Progress Notes (Signed)
Patient scheduled while in office. Spoke with Karolee StampsJanelle, patient scheduled for PUS at [facility] on 04/23/17 at 2pm. Advised patient to drink 32oz water, arrive with full bladder. Patient verbalizes understanding and is agreeable.  Written order to Dr. Edward JollySilva for signature and faxing.  [facility] [address], [address] [place], [place] [address] Phone - [phone] Fax [phone]

## 2017-04-18 NOTE — Progress Notes (Signed)
GYNECOLOGY  VISIT   HPI: 46 y.o.   Married  Caucasian  female   G2P2002 with Patient's last menstrual period was 04/10/2017.   here for evaluation of "nagging",intermittent pelvic pain. Left ovarian cyst.  Nagging left lower quadrant pain.  Can be mild or significant.  Tried Motrin occasionally.  Hurts to do yoga.  Pain on left anterior iliac spine and radiates down the left leg.  No bowel symptoms.  No dysuria, hematuria, or difficulty voiding.   Some monthly spotting with Mirena.  Had pelvic ultrasound at [facility] on 02/26/17 and 3.2 cm left ovarian simple cyst noted.  Uterus with no masses.  IUD in endometrial canal. No free fluid.  GYNECOLOGIC HISTORY: Patient's last menstrual period was 04/10/2017. Contraception:  Mirena IUD inserted 11-16-15 Menopausal hormone therapy:  n/a Last mammogram: 11-06-16 Density C/Neg/BiRads1:[facility] Last pap smear:  08-30-15 Neg:Neg HR HPV; 2013 Neg:Neg HR HPV--Hx of colposcopy revealing CIN I in her 1620s with no treatment to cervix.        OB History    Gravida Para Term Preterm AB Living   2 2 2     2    SAB TAB Ectopic Multiple Live Births                     Patient Active Problem List   Diagnosis Date Noted  . Left ankle injury 06/24/2012    Past Medical History:  Diagnosis Date  . 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 Anxiety     History reviewed. No pertinent surgical history.  Current Outpatient Prescriptions  Medication Sig Dispense Refill  . acyclovir (ZOVIRAX) 200 MG capsule Take 4 capsules twice a day for up to 5 days. 30 capsule 2  . levonorgestrel (MIRENA) 20 MCG/24HR IUD 1 each by Intrauterine route once.     No current facility-administered medications for this visit.      ALLERGIES: Patient has no known allergies.  Family History  Problem Relation Age of Onset  . Hypertension Mother   . Diabetes Mother   . Hypertension Father   . Melanoma  Father   . Breast cancer Paternal Grandmother [phone]  . Hypertension Paternal Grandmother   . Diabetes Maternal Grandmother   . Hypertension Maternal Grandmother   . Stroke Maternal Grandmother   . Diabetes Maternal Grandfather   . Hypertension Maternal Grandfather   . Stroke Maternal Grandfather   . Hypertension Paternal Grandfather     Social History   Social History  . Marital status: Married    Spouse name: N/A  . Number of children: N/A  . Years of education: N/A   Occupational History  . Not on file.   Social History Main Topics  . Smoking status: Never Smoker  . Smokeless tobacco: Never Used  . Alcohol use 0.6 oz/week    1 Glasses of wine per week  . Drug use: No  . Sexual activity: Yes    Partners: Male    Birth control/ protection: IUD     Comment: Mirena inserted 11-19-15   Other Topics Concern  . Not on file   Social History Narrative  . No narrative on file    ROS:  Pertinent items are noted in HPI.  PHYSICAL EXAMINATION:    BP 102/68 (BP Location: Right Arm, Patient Position: Sitting, Cuff Size: Large)   Pulse 68   Resp 16   Wt 191 lb (86.6 kg)  LMP 04/10/2017   BMI 29.04 kg/m     General appearance: alert, cooperative and appears stated age   Abdomen: soft, non-tender, no masses,  no organomegaly   Pelvic: External genitalia:  no lesions              Urethra:  normal appearing urethra with no masses, tenderness or lesions              Bartholins and Skenes: normal                 Vagina: normal appearing vagina with normal color and discharge, no lesions              Cervix: no lesions.  IUD strings noted.                 Bimanual Exam:  Uterus:  normal size, contour, position, consistency, mobility, non-tender.  Uterus feels left of midline.               Adnexa: no mass, fullness, tenderness              Rectal exam: Yes.  .  Confirms.              Anus:  normal sphincter tone, no lesions  Chaperone was present for  exam.  ASSESSMENT  LLQ pain.  Simple left ovarian cyst.  Mirena IUD patient.   PLAN  We discussed using Colace as a way to treat potential discomfort due to constipation.  Will get follow up pelvic US at [facility].  If the ultrasound is normal, we can proceed with an abdominal and pelvic CT to look an non gynecologic causes for the pain.    An After Visit Summary was printed and given to the patient.  __15____ minutes face to face time of which over 50% was spent in counseling.

## 2017-04-26 ENCOUNTER — Telehealth: Payer: Self-pay | Admitting: Obstetrics and Gynecology

## 2017-04-26 DIAGNOSIS — N83202 Unspecified ovarian cyst, left side: Secondary | ICD-10-CM

## 2017-04-26 NOTE — Telephone Encounter (Addendum)
Please inform patient of pelvic ultrasound report [facility]. IUD in the endometrial canal. No fibroids or uterine masses. Her left ovarian cyst is now 3.8 x 3.6 x 3.3 cm with a single septation.  Previously the left ovarian cyst measured 3.2 x 2.4 x 3.2 cm.   Patient may be a candidate for a short course of LoEstrin 1/20 for 3 months to see if the LLQ pain and cyst resolve.  I do not recall if she has taken combined oral contraception in the past.  Recommendation for a follow up ultrasound in 8 - 12 weeks. I would really like to be able to do this in our office if possible.

## 2017-04-27 NOTE — Telephone Encounter (Signed)
Left message to call Isaiah Torok at [phone].  

## 2017-05-01 NOTE — Telephone Encounter (Signed)
Spoke with patient, advised of results and recommendations as seen below per Dr. Edward JollySilva. Patient scheduled for PUS on 07/12/17 at 9:30am with consult to follow at 10am. Patient declines OCP at this time, would like to give it another month to see if pain resolves, will return call if desires RX for OCP. Advised patient to return call to office with any additional questions/concerns. Patient verbalizes understanding and is agreeable.  Order placed for PUS.  Routing to provider for final review. Patient is agreeable to disposition. Will close encounter.  Cc: Harland DingwallSuzy Luhn

## 2017-05-01 NOTE — Telephone Encounter (Signed)
Left message to call Kae Lauman at [phone].  

## 2017-05-04 ENCOUNTER — Telehealth: Payer: Self-pay | Admitting: Obstetrics and Gynecology

## 2017-05-04 MED ORDER — LEVONORGESTREL-ETHINYL ESTRAD 0.1-20 MG-MCG PO TABS: 1.0000 | ORAL_TABLET | Freq: Every day | ORAL | 0 refills | Status: DC

## 2017-05-04 NOTE — Telephone Encounter (Signed)
Spoke with patient, advised as seen below per Dr. Edward JollySilva. RX to verified pharmacy on file. Patient verbalizes understanding and is agreeable.  Routing to provider for final review. Patient is agreeable to disposition. Will close encounter.

## 2017-05-04 NOTE — Telephone Encounter (Signed)
Alesse is Ok.  Generic is fine.  Ok for 3 months.  Keep US appt.

## 2017-05-04 NOTE — Telephone Encounter (Signed)
Patient is returning a call to Jill. °

## 2017-05-04 NOTE — Telephone Encounter (Signed)
Spoke with patient. Patient following up from telephone encounter dated 04/26/17. Patient would like to try OCP, but would like alternative to LoEstrin. Patient states she has tried this many years ago and did not like it, would like alternative Seronyx or Alesse. Advised patient would review with Dr. Edward JollySilva and return call with recommendations. Advised patient Dr. Edward JollySilva is still seeing patients, response may not be immediate, patient is agreeable.   Dr. Edward JollySilva, please advise?

## 2017-05-09 ENCOUNTER — Encounter: Payer: Self-pay | Admitting: Obstetrics and Gynecology

## 2017-07-03 ENCOUNTER — Telehealth: Payer: Self-pay | Admitting: Obstetrics and Gynecology

## 2017-07-03 NOTE — Telephone Encounter (Signed)
Call placed to patient to review benefits for a scheduled ultrasound. Left voicemail requesting a return call. °

## 2017-07-04 NOTE — Telephone Encounter (Signed)
Please contact patient to see if she would like to come in tomorrow morning for her ultrasound instead of next week.   Cc- Harland DingwallSuzy Weaber

## 2017-07-04 NOTE — Telephone Encounter (Signed)
Spoke with patient. Patient declined to move PUS up to 7/26 d/t work schedule. Patient thankful for offer, aware to call office with questions/concerns, will plan to see Dr. Edward JollySilva on 07/12/17.   Routing to provider for final review. Patient is agreeable to disposition. Will close encounter.

## 2017-07-04 NOTE — Telephone Encounter (Signed)
Spoke with patient regarding benefit for scheduled ultrasound. Patient understood and agreeable. Patient scheduled 07/12/17 with Dr Edward JollySilva. Patient aware of date, arrival time and [employer] policy. Patient stated in conversation symptoms have been more painful over the last month and she is anxious for her appointment, but states she will discuss this in further detail with Dr Edward JollySilva at her appointment.  Routing to Dr Edward JollySilva

## 2017-07-12 ENCOUNTER — Ambulatory Visit (INDEPENDENT_AMBULATORY_CARE_PROVIDER_SITE_OTHER): Payer: Managed Care, Other (non HMO)

## 2017-07-12 ENCOUNTER — Ambulatory Visit (INDEPENDENT_AMBULATORY_CARE_PROVIDER_SITE_OTHER): Payer: Managed Care, Other (non HMO) | Admitting: Obstetrics and Gynecology

## 2017-07-12 ENCOUNTER — Encounter: Payer: Self-pay | Admitting: Obstetrics and Gynecology

## 2017-07-12 VITALS — BP 114/60 | HR 68 | Resp 16 | Wt 192.0 lb

## 2017-07-12 DIAGNOSIS — N83202 Unspecified ovarian cyst, left side: Secondary | ICD-10-CM | POA: Diagnosis not present

## 2017-07-12 DIAGNOSIS — R1032 Left lower quadrant pain: Secondary | ICD-10-CM | POA: Diagnosis not present

## 2017-07-12 NOTE — Progress Notes (Signed)
GI Appointment made with Dr.Mary Shearin at [facility] for Monday 07-16-17 at 10:00am.

## 2017-07-12 NOTE — Progress Notes (Signed)
Records faxed to Dr.Shearin at [facility]--Fax #[phone].

## 2017-07-12 NOTE — Progress Notes (Signed)
GYNECOLOGY  VISIT   HPI: 46 y.o.   Married  Caucasian  female   G2P2002 with Patient's last menstrual period was 07/10/2017.   here for ultrasound recheck of left ovarian cyst and LLQ pain.   LLQ pain persists. Denies constipation, blood in stool, blood in urine, malaise, fever.  Was on combined oral contraceptive pills for 2 months to try to involute the ovarian cyst.  She has a Mirena IUD.   Had colonoscopy 15 years ago due to rectal bleeding after her son was born.  Had internal hemorrhoids. Would like to see [facility].   GYNECOLOGIC HISTORY: Patient's last menstrual period was 07/10/2017. Contraception:  Mirena IUD inserted 11/16/15 Menopausal hormone therapy:  n/a Last mammogram:  11-06-16 Density C/Neg/BiRads1:[facility] Last pap smear:   08-30-15 Neg:Neg HR HPV; 2013 Neg:Neg HR HPV--Hx of colposcopy revealing CIN I in her 4920s with no treatment to cervix.        OB History    Gravida Para Term Preterm AB Living   2 2 2     2    SAB TAB Ectopic Multiple Live Births                     Patient Active Problem List   Diagnosis Date Noted  . Left ankle injury 06/24/2012    Past Medical History:  Diagnosis Date  . 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 Anxiety     History reviewed. No pertinent surgical history.  Current Outpatient Prescriptions  Medication Sig Dispense Refill  . acyclovir (ZOVIRAX) 200 MG capsule Take 4 capsules twice a day for up to 5 days. 30 capsule 2  . levonorgestrel (MIRENA) 20 MCG/24HR IUD 1 each by Intrauterine route once.     No current facility-administered medications for this visit.      ALLERGIES: Patient has no known allergies.  Family History  Problem Relation Age of Onset  . Hypertension Mother   . Diabetes Mother   . Hypertension Father   . Melanoma Father   . Breast cancer Paternal Grandmother [id number]  . Hypertension Paternal Grandmother   . Diabetes  Maternal Grandmother   . Hypertension Maternal Grandmother   . Stroke Maternal Grandmother   . Diabetes Maternal Grandfather   . Hypertension Maternal Grandfather   . Stroke Maternal Grandfather   . Hypertension Paternal Grandfather     Social History   Social History  . Marital status: Married    Spouse name: N/A  . Number of children: N/A  . Years of education: N/A   Occupational History  . Not on file.   Social History Main Topics  . Smoking status: Never Smoker  . Smokeless tobacco: Never Used  . Alcohol use 0.6 oz/week    1 Glasses of wine per week  . Drug use: No  . Sexual activity: Yes    Partners: Male    Birth control/ protection: IUD     Comment: Mirena inserted 11-19-15   Other Topics Concern  . Not on file   Social History Narrative  . No narrative on file    ROS:  Pertinent items are noted in HPI.  PHYSICAL EXAMINATION:    BP 114/60 (BP Location: Right Arm, Patient Position: Sitting, Cuff Size: Normal)   Pulse 68   Resp 16   Wt 192 lb (87.1 kg)   LMP 07/10/2017   BMI 29.19 kg/m     General appearance:  alert, cooperative and appears stated age   Pelvic ultrasound: Uterus with no masses.  EMS 5.49 mm.  IUD in endometrial canal.  Ovaries normal.  Left ovary with small follicle.  No free fluid.  ASSESSMENT  Perisistent LLQ pain.  Left ovarian cyst has resolved.  PLAN  We discussed CT scan of abdomen and pelvis, which she declines.  She would like referral to GI at this time. Follow up prn.    An After Visit Summary was printed and given to the patient.  __10____ minutes face to face time of which over 50% was spent in counseling.

## 2017-07-12 NOTE — Progress Notes (Signed)
Encounter reviewed by Dr. Brook Amundson C. Silva.  

## 2017-09-28 ENCOUNTER — Ambulatory Visit (INDEPENDENT_AMBULATORY_CARE_PROVIDER_SITE_OTHER): Payer: Managed Care, Other (non HMO) | Admitting: Family Medicine

## 2017-09-28 ENCOUNTER — Encounter: Payer: Self-pay | Admitting: Family Medicine

## 2017-09-28 ENCOUNTER — Ambulatory Visit (HOSPITAL_BASED_OUTPATIENT_CLINIC_OR_DEPARTMENT_OTHER)
Admission: RE | Admit: 2017-09-28 | Discharge: 2017-09-28 | Disposition: A | Discharge status: Home / Self Care | Payer: Managed Care, Other (non HMO) | Source: Ambulatory Visit | Attending: Family Medicine | Admitting: Family Medicine

## 2017-09-28 VITALS — BP 126/92 | HR 76 | Ht 68.0 in | Wt 188.0 lb

## 2017-09-28 DIAGNOSIS — R1032 Left lower quadrant pain: Secondary | ICD-10-CM

## 2017-09-28 DIAGNOSIS — M25552 Pain in left hip: Secondary | ICD-10-CM

## 2017-10-04 DIAGNOSIS — M25552 Pain in left hip: Secondary | ICD-10-CM | POA: Insufficient documentation

## 2017-10-04 NOTE — Assessment & Plan Note (Signed)
independently reviewed radiographs and no evidence abnormalities especially in area of tenderness/pain.  She has had extensive GI and Gyn workup without evidence of cause for her pain.  She does have bony tenderness of iliac crest.  No history of malignancy.  Last mammogram < 1 year ago was negative.  Discussed options and reviewed radiology appropriateness criteria - will go ahead with Ct abd/pelvis with IV contrast as next step.  Tylenol, ibuprofen if needed in meantime.

## 2017-10-04 NOTE — Progress Notes (Addendum)
PCP: Everrett CoombeMatthews, Cody, DO  Subjective:   HPI: Patient is a 46 y.o. female here for left hip pain.  Patient reports she's had about 10-11 months of left hip, left lower quadrant pain. States she feels this primarily over bony prominence anterior left pelvis/hip area. Pain doesn't radiate. Noticed initially with yoga and hip exercises. Worse at night and better during the day. Worse after she runs especially in past month. Pain is 1/10 and dull currently. Has had pelvis and abnormal ultrasounds, colonoscopy without noting cause of pain. No numbness or tingling. No bowel/bladder issues. No other complaints.  Past Medical History:  Diagnosis Date  . 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 Anxiety     Current Outpatient Prescriptions on File Prior to Visit  Medication Sig Dispense Refill  . acyclovir (ZOVIRAX) 200 MG capsule Take 4 capsules twice a day for up to 5 days. 30 capsule 2  . levonorgestrel (MIRENA) 20 MCG/24HR IUD 1 each by Intrauterine route once.     No current facility-administered medications on file prior to visit.     No past surgical history on file.  No Known Allergies  Social History   Social History  . Marital status: Married    Spouse name: N/A  . Number of children: N/A  . Years of education: N/A   Occupational History  . Not on file.   Social History Main Topics  . Smoking status: Never Smoker  . Smokeless tobacco: Never Used  . Alcohol use 0.6 oz/week    1 Glasses of wine per week  . Drug use: No  . Sexual activity: Yes    Partners: Male    Birth control/ protection: IUD     Comment: Mirena inserted 11-19-15   Other Topics Concern  . Not on file   Social History Narrative  . No narrative on file    Family History  Problem Relation Age of Onset  . Hypertension Mother   . Diabetes Mother   . Hypertension Father   . Melanoma Father   . Breast cancer Paternal Grandmother [id number]  .  Hypertension Paternal Grandmother   . Diabetes Maternal Grandmother   . Hypertension Maternal Grandmother   . Stroke Maternal Grandmother   . Diabetes Maternal Grandfather   . Hypertension Maternal Grandfather   . Stroke Maternal Grandfather   . Hypertension Paternal Grandfather     BP (!) 126/92   Pulse 76   Ht 5\' 8"  (1.727 m)   Wt 188 lb (85.3 kg)   BMI 28.59 kg/m   Review of Systems: See HPI above.     Objective:  Physical Exam:  Gen: NAD, comfortable in exam room  Back: No gross deformity, scoliosis. No paraspinal TTP.  No midline or bony TTP. FROM without reproduction of pain. Strength LEs 5/5 all muscle groups.   2+ MSRs in patellar and achilles tendons, equal bilaterally. Negative SLRs. Sensation intact to light touch bilaterally.  Left hip: No gross deformity, swelling. TTP over ASIS, anterior portion of iliac crest.  No other tenderness. FROM hip with negative logroll. Strength 5/5 all motions without reproduction of pain including sartorius testing. Negative fabers and piriformis stretches.   Assessment & Plan:  1. Left hip pain - independently reviewed radiographs and no evidence abnormalities especially in area of tenderness/pain.  She has had extensive GI and Gyn workup without evidence of cause for her pain.  She does have bony tenderness of iliac  crest.  No history of malignancy.  Last mammogram < 1 year ago was negative.  Discussed options and reviewed radiology appropriateness criteria - will go ahead with Ct abd/pelvis with IV contrast as next step.  Tylenol, ibuprofen if needed in meantime.    Addendum:  CT scan reviewed and discussed with patient.  Nothing to account for the pain she's having unfortunately.  We discussed pilates, consider PT to focus on core strengthening and that we could evaluate her gait though I doubt this would be beneficial given where her pain is located.  She will keep [place] updated.  I also advised her about the very small area on  her liver and to follow up with her PCP about this in the future - may need repeat imaging in 6 months to a year to reevaluate, make sure this is not growing.

## 2017-10-09 NOTE — Addendum Note (Signed)
Addended by: Kathi SimpersWISE, Sole Lengacher F on: 10/09/2017 02:30 PM   Modules accepted: Orders

## 2017-10-11 ENCOUNTER — Other Ambulatory Visit (HOSPITAL_BASED_OUTPATIENT_CLINIC_OR_DEPARTMENT_OTHER): Payer: Managed Care, Other (non HMO)

## 2017-10-18 NOTE — Addendum Note (Signed)
Addended by: Kathi SimpersWISE, [place] Hair F on: 10/18/2017 04:30 PM   Modules accepted: Orders

## 2017-10-19 ENCOUNTER — Ambulatory Visit (HOSPITAL_BASED_OUTPATIENT_CLINIC_OR_DEPARTMENT_OTHER): Payer: Managed Care, Other (non HMO)

## 2017-10-24 ENCOUNTER — Ambulatory Visit
Admission: RE | Admit: 2017-10-24 | Discharge: 2017-10-24 | Disposition: A | Discharge status: Home / Self Care | Payer: Managed Care, Other (non HMO) | Source: Ambulatory Visit | Attending: Family Medicine | Admitting: Family Medicine

## 2017-10-24 DIAGNOSIS — R1032 Left lower quadrant pain: Secondary | ICD-10-CM

## 2017-10-24 MED ORDER — IOPAMIDOL (ISOVUE-300) INJECTION 61%
100.0000 mL | Freq: Once | INTRAVENOUS | Status: AC | PRN
  Administered 2017-10-24: 100 mL via INTRAVENOUS

## 2018-02-04 IMAGING — DX DG HIP (WITH OR WITHOUT PELVIS) 2-3V*L*
3 series · 3 of 3 positions shown · non-contrast
Comparison: None.

CLINICAL DATA: Chronic left hip pain without known injury.

EXAM:
DG HIP (WITH OR WITHOUT PELVIS) 2-3V LEFT

[pelvis ap]
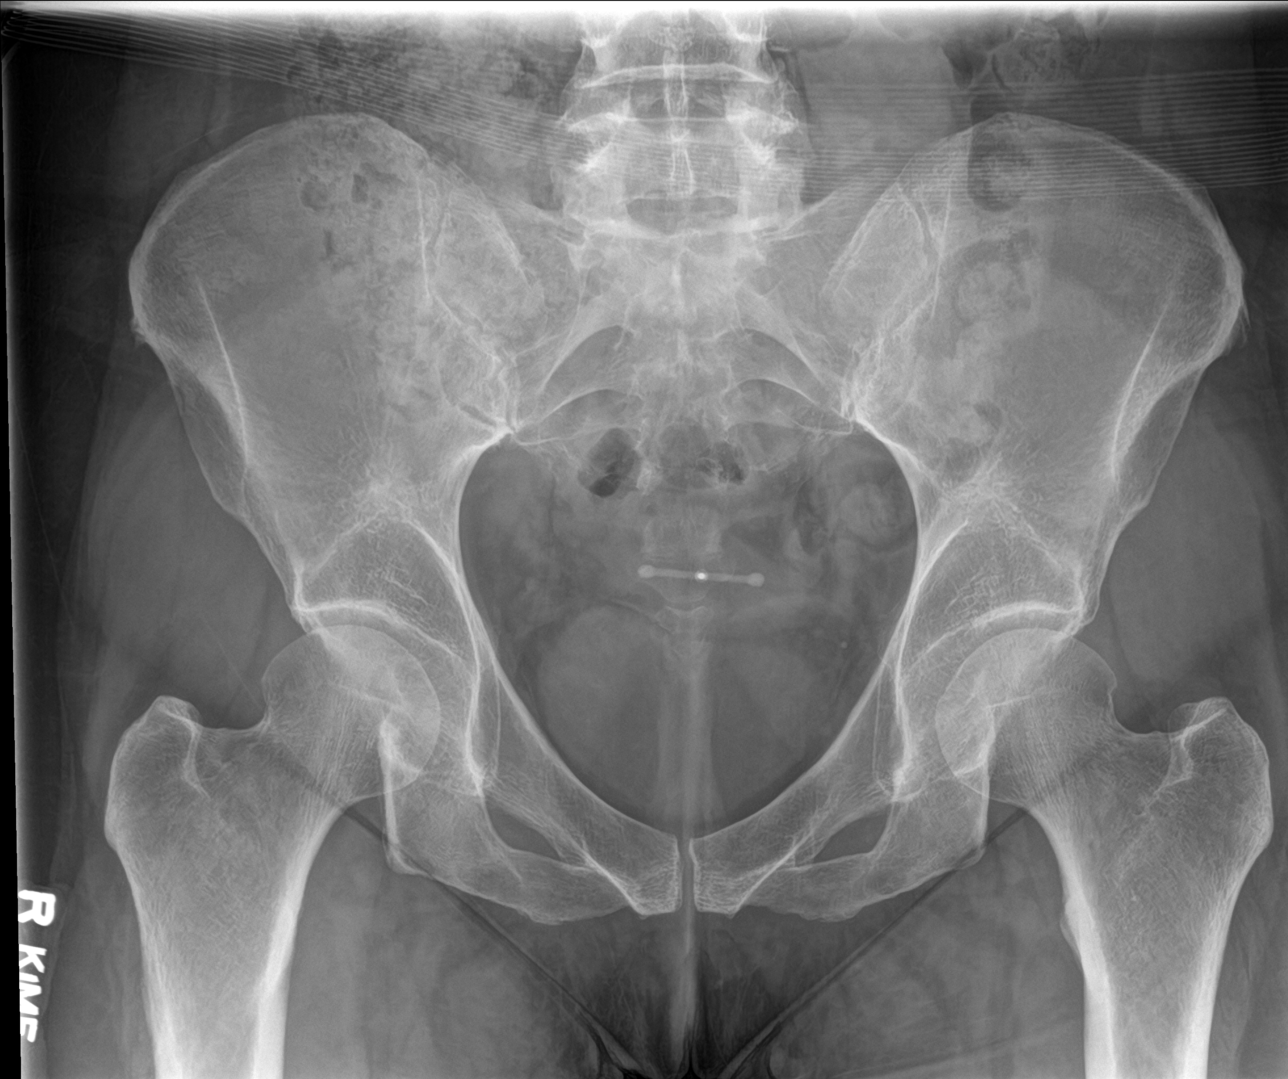

[hip ap]
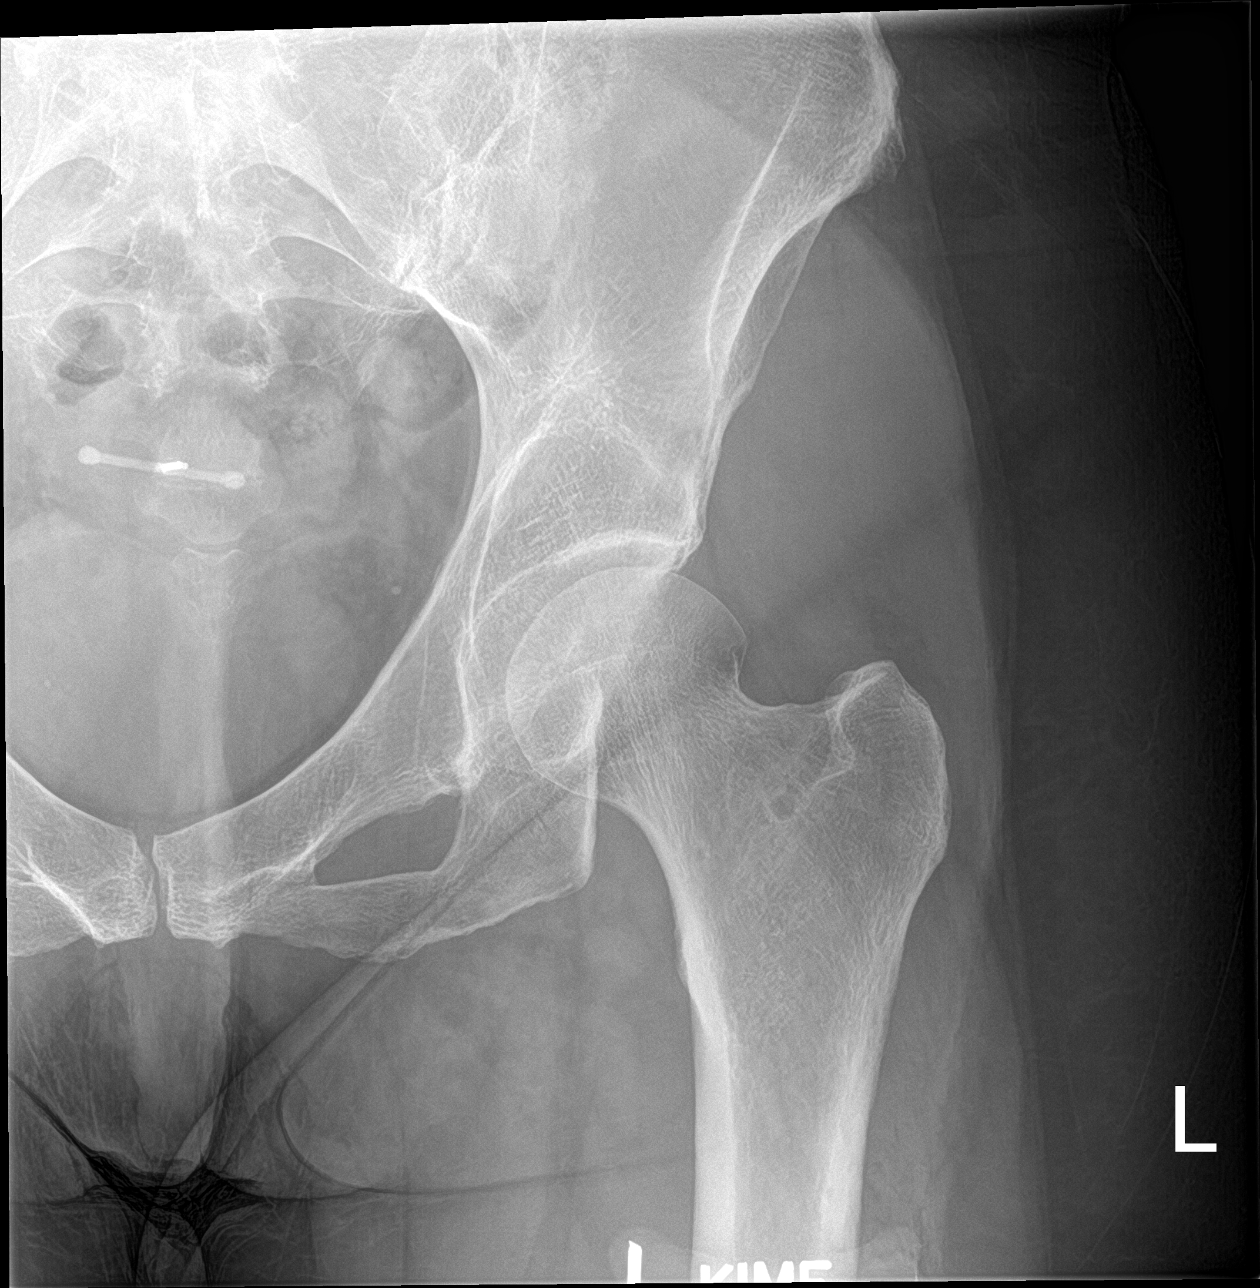

[hip lat]
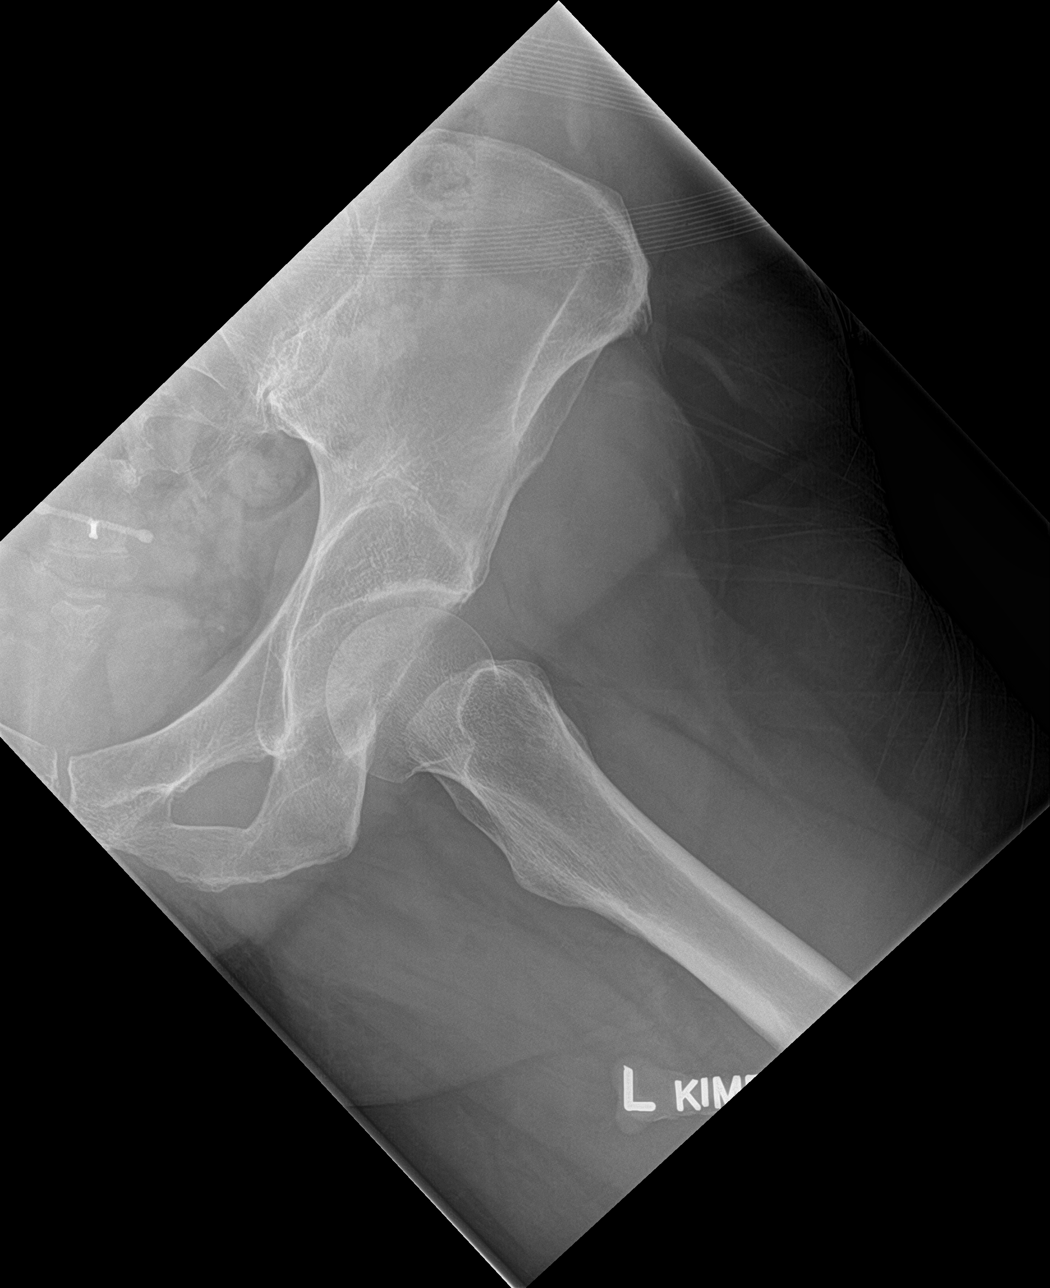

[3 of 3 positions shown; findings below may reference images not displayed]

FINDINGS: There is no evidence of hip fracture or dislocation. There is no
evidence of arthropathy or other focal bone abnormality.
IMPRESSION: Normal left hip.

## 2018-03-02 IMAGING — CT CT ABD-PELV W/ CM
1 of 3 series · 14 of 32 positions shown, 19 images · IV contrast (iopamidol)
Comparison: Pelvic ultrasound 02/26/2017. Abdominal ultrasound
02/26/2017.

CLINICAL DATA: Left lower quadrant pain for 11 months. Normal
colonoscopy.

EXAM:
CT ABDOMEN AND PELVIS WITH CONTRAST
TECHNIQUE: Multidetector CT imaging of the abdomen and pelvis was performed
using the standard protocol following bolus administration of
intravenous contrast.
CONTRAST:  100mL [id number] IOPAMIDOL ([id number]) INJECTION 61%

[Series 2: abd/pelvis w/cm · axial · 0.81mm/px · z∈[+636,+1126]mm · 14 of 110 slices shown, 19 images]
[im 6/110  soft-tissue]
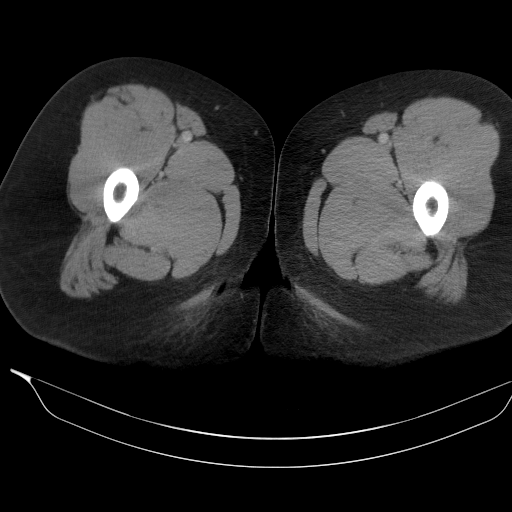
[im 6/110  bone]
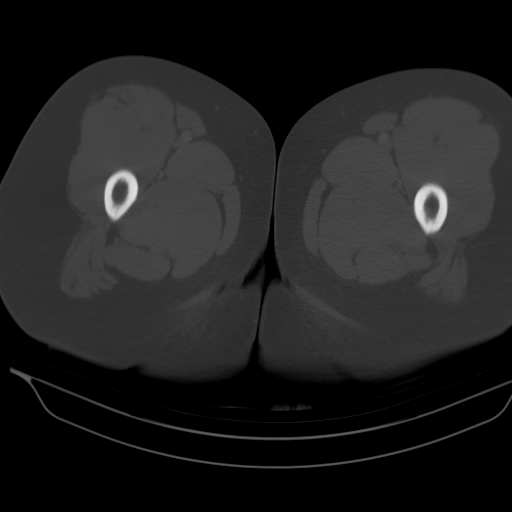
[im 17/110  soft-tissue]
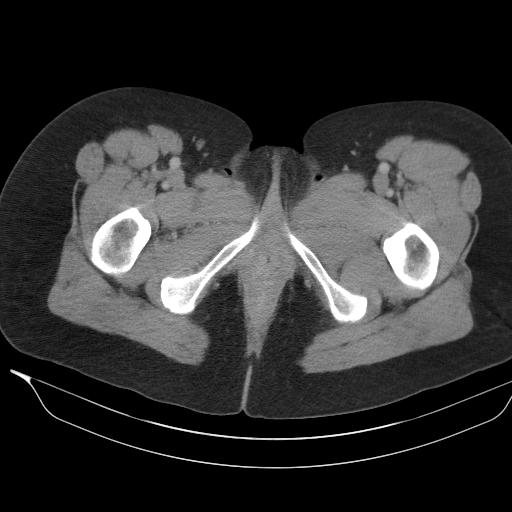
[im 22/110  soft-tissue]
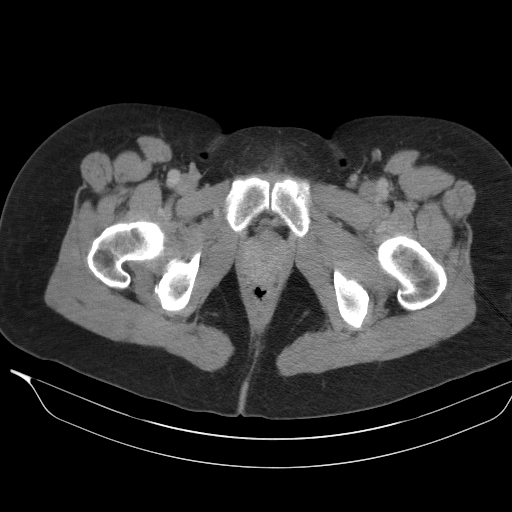
[im 33/110  soft-tissue]
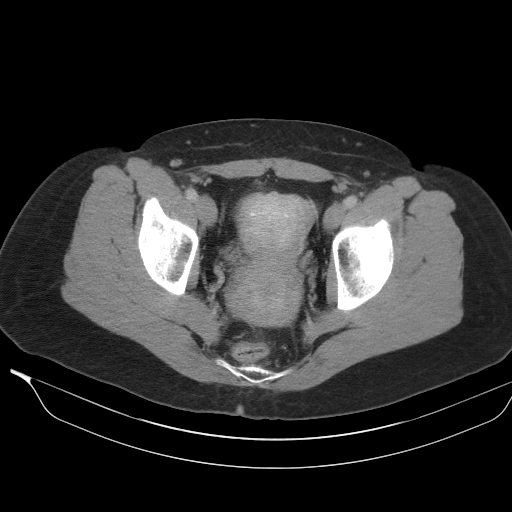
[im 39/110  soft-tissue]
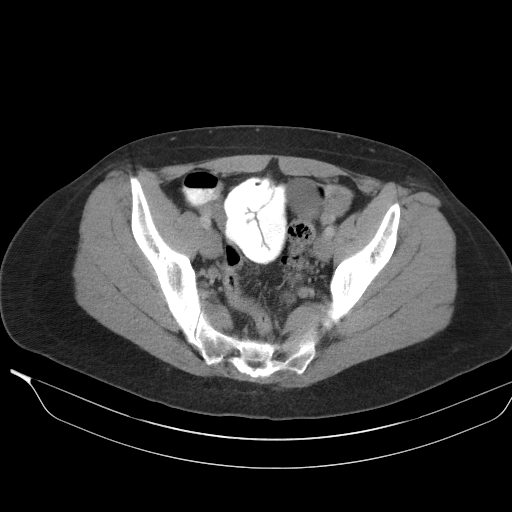
[im 50/110  soft-tissue]
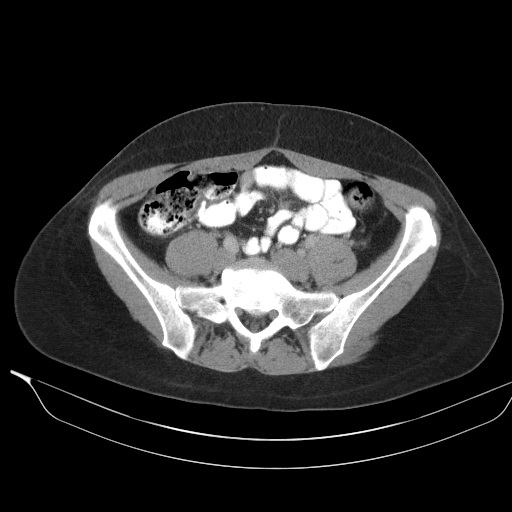
[im 55/110  soft-tissue]
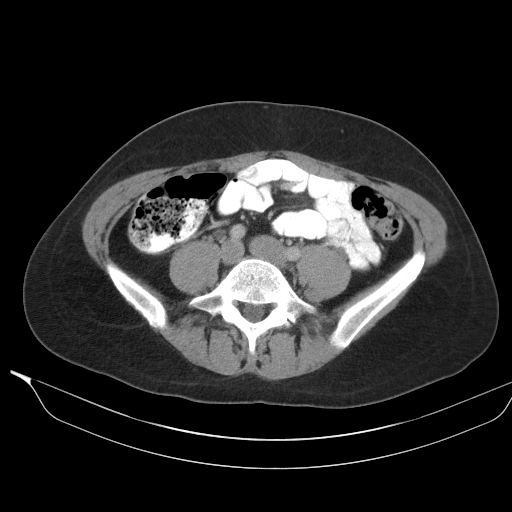
[im 60/110  soft-tissue]
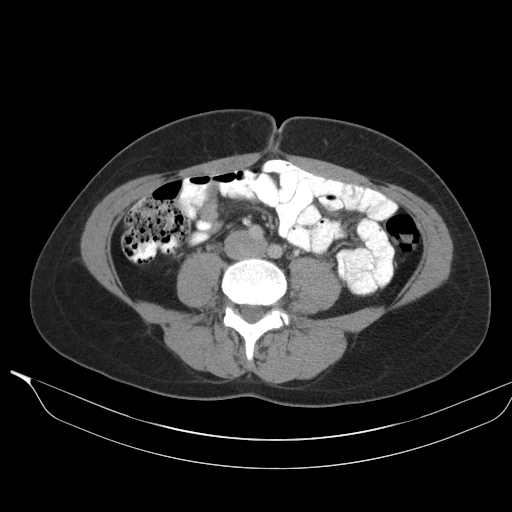
[im 71/110  soft-tissue]
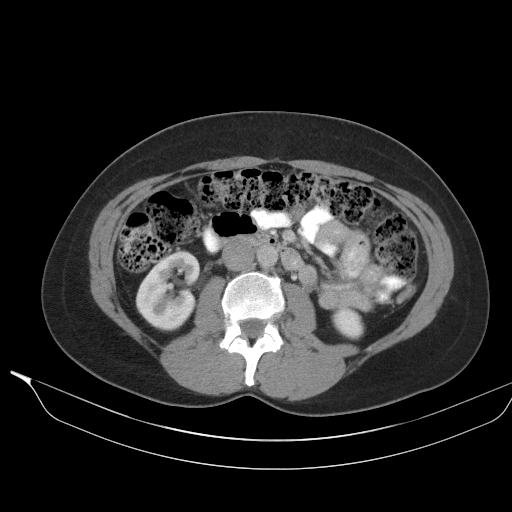
[im 71/110  bone]
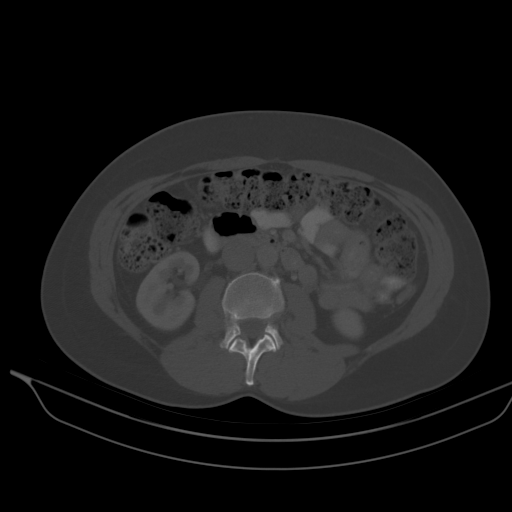
[im 77/110  soft-tissue]
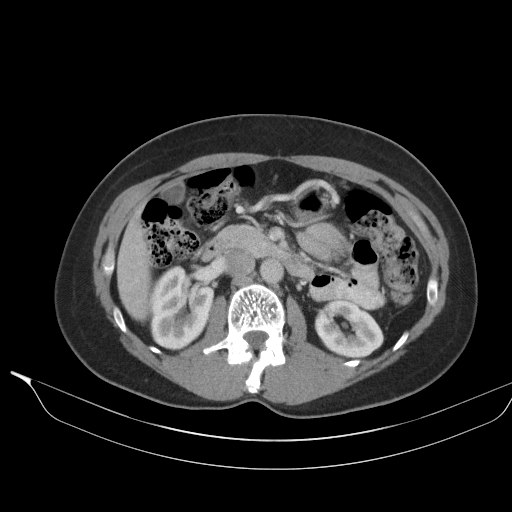
[im 88/110  soft-tissue]
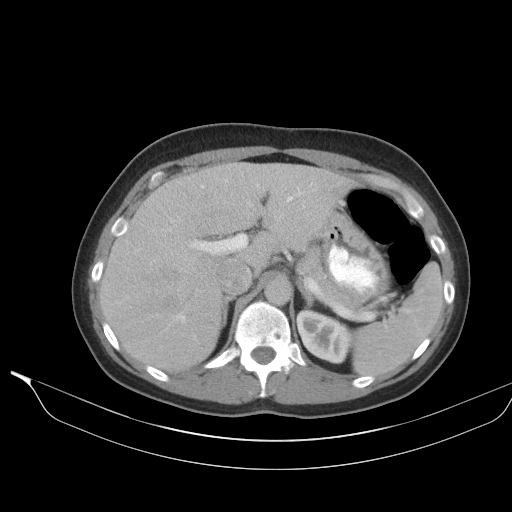
[im 88/110  lung]
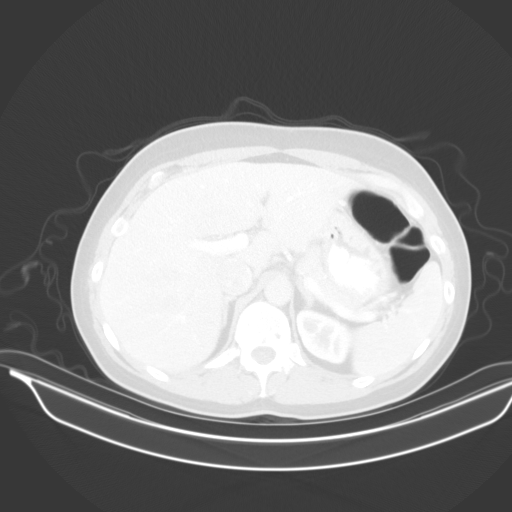
[im 93/110  soft-tissue]
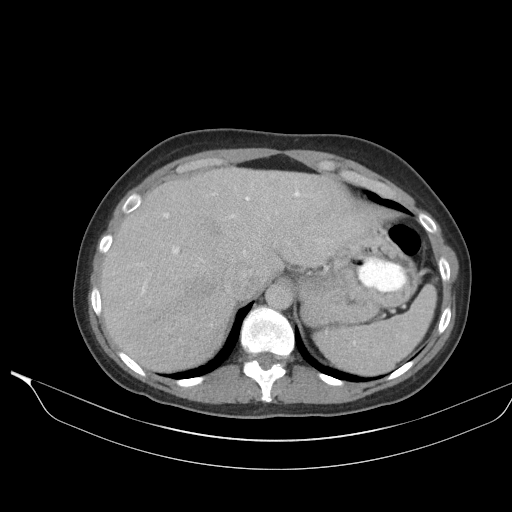
[im 93/110  lung]
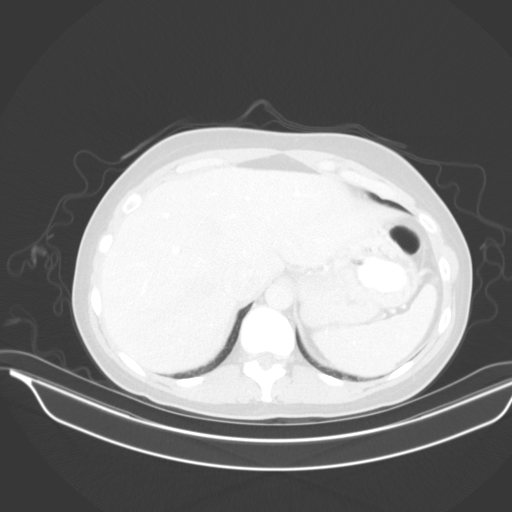
[im 99/110  lung]
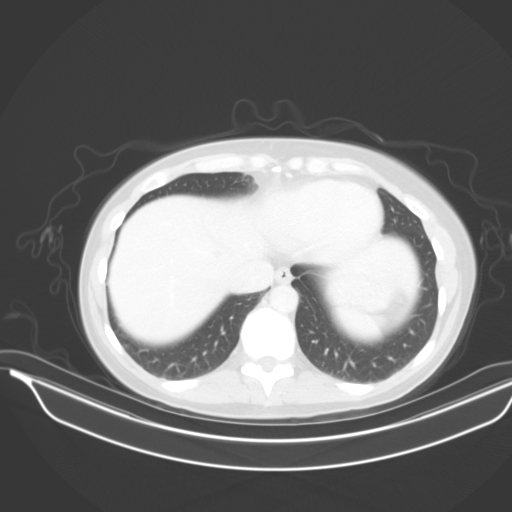
[im 104/110  soft-tissue]
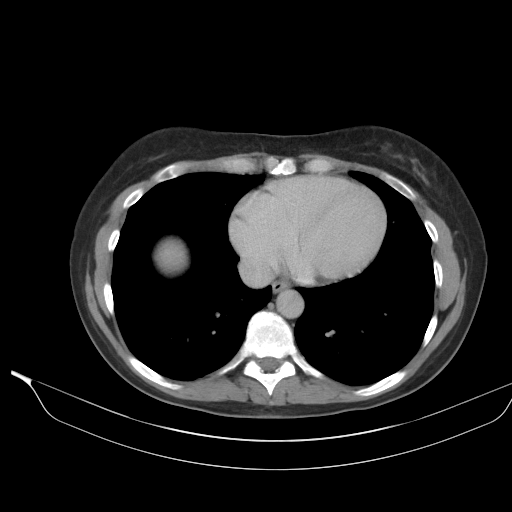
[im 104/110  lung]
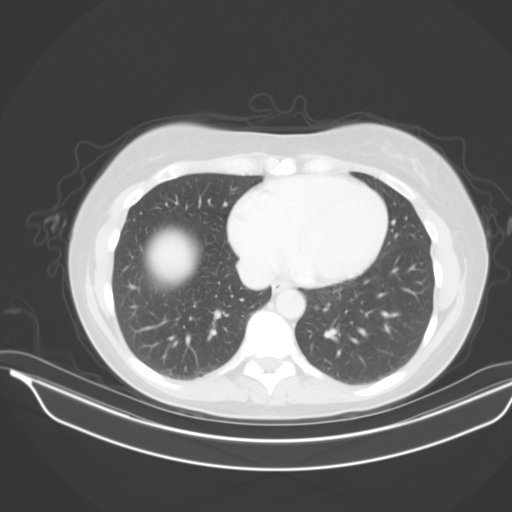

[14 of 32 positions shown; findings below may reference images not displayed]

FINDINGS: Lower chest: Clear lung bases. Normal heart size without pericardial
or pleural effusion.

Hepatobiliary: A too small to characterize high left hepatic lobe
lesion. No suspicious liver lesion. Normal gallbladder, without
biliary ductal dilatation.

Pancreas: Interdigitation of peripancreatic fat. No pancreatic duct
dilatation or dominant mass.

Spleen: Normal in size, without focal abnormality.

Adrenals/Urinary Tract: Normal adrenal glands. Normal kidneys,
without hydronephrosis. Normal urinary bladder.

Stomach/Bowel: Normal stomach, without wall thickening. Moderate
amount of colonic stool. Normal terminal ileum. Normal appendix,
including on coronal image 43. Normal small bowel.

Vascular/Lymphatic: Normal caliber of the aorta and branch vessels.
No abdominopelvic adenopathy.

Reproductive: Intrauterine device. Left ovarian cyst of 3.2 cm. No
right adnexal mass.

Other: No significant free fluid.

Musculoskeletal: Degenerative disc disease at the lumbosacral
junction. Disc bulges at L3-4 and L4-5.
IMPRESSION: 1.  Possible constipation.
2. No other explanation for left lower quadrant pain.
3. Simple left ovarian cyst, residual or recurrent relative to
02/26/2017.

## 2018-09-09 ENCOUNTER — Encounter: Payer: Self-pay | Admitting: Obstetrics and Gynecology

## 2018-12-20 ENCOUNTER — Ambulatory Visit: Payer: Managed Care, Other (non HMO) | Admitting: Obstetrics and Gynecology

## 2018-12-23 NOTE — Progress Notes (Signed)
GYNECOLOGY  VISIT   HPI: 48 y.o.   Married  Caucasian  female   G2P2002 with Patient's last menstrual period was 09/10/2018.   here for consult for hot flashes.  Waking up with night sweats.  Feeling more tired due to poor sleep.  Voiding at night.   Hot flashes during the day.  Had 12 hot flashes in 24 hours.   Has menses one day of spotting three times a year.   Lost 20 pounds with Weight [employer] and is feeling good.   States she believes her LLQ is musculoskeletal from doing yoga.  She had a CT scan, and it was normal.  Also had a colonoscopy.   Needs refill of Acyclovir for cold sores.   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   GYNECOLOGIC HISTORY: Patient's last menstrual period was 09/10/2018. Contraception: Mirena 11-16-15 Menopausal hormone therapy:  none Last mammogram: 08-16-18 Neg/density C/BiRads1 Last pap smear:   08-30-15 Neg:Neg HR HPV; 2013 Neg:Neg HR HPV--Hx of colposcopy revealing CIN I in her 49s with no treatment to cervix        OB History    Gravida  2   Para  2   Term  2   Preterm      AB      Living  2     SAB      TAB      Ectopic      Multiple      Live Births                 Patient Active Problem List   Diagnosis Date Noted  . Left hip pain 10/04/2017  . LLQ pain 07/12/2017  . Left ankle injury 06/24/2012    Past Medical History:  Diagnosis Date  . 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 Anxiety     No past surgical history on file.  Current Outpatient Medications  Medication Sig Dispense Refill  . acyclovir (ZOVIRAX) 200 MG capsule Take 4 capsules twice a day for up to 5 days. 30 capsule 2  . levonorgestrel (MIRENA) 20 MCG/24HR IUD 1 each by Intrauterine route once.     No current facility-administered medications for this visit.      ALLERGIES: Patient has no known allergies.  Family History   Problem Relation Age of Onset  . Hypertension Mother   . Diabetes Mother   . Hypertension Father   . Melanoma Father   . Breast cancer Paternal Grandmother 40  . Hypertension Paternal Grandmother   . Diabetes Maternal Grandmother   . Hypertension Maternal Grandmother   . Stroke Maternal Grandmother   . Diabetes Maternal Grandfather   . Hypertension Maternal Grandfather   . Stroke Maternal Grandfather   . Hypertension Paternal Grandfather     Social History   Socioeconomic History  . Marital status: Married    Spouse name: Not on file  . Number of children: Not on file  . Years of education: Not on file  . Highest education level: Not on file  Occupational History  . Not on file  Social Needs  . Financial resource strain: Not on file  . Food insecurity:    Worry: Not on file    Inability: Not on file  . Transportation needs:    Medical: Not on file    Non-medical: Not on file  Tobacco  Use  . Smoking status: Never Smoker  . Smokeless tobacco: Never Used  Substance and Sexual Activity  . Alcohol use: Yes    Alcohol/week: 1.0 standard drinks    Types: 1 Glasses of wine per week  . Drug use: No  . Sexual activity: Yes    Partners: Male    Birth control/protection: I.U.D.    Comment: Mirena inserted 11-19-15  Lifestyle  . Physical activity:    Days per week: Not on file    Minutes per session: Not on file  . Stress: Not on file  Relationships  . Social connections:    Talks on phone: Not on file    Gets together: Not on file    Attends religious service: Not on file    Active member of club or organization: Not on file    Attends meetings of clubs or organizations: Not on file    Relationship status: Not on file  . Intimate partner violence:    Fear of current or ex partner: Not on file    Emotionally abused: Not on file    Physically abused: Not on file    Forced sexual activity: Not on file  Other Topics Concern  . Not on file  Social History Narrative   . Not on file    Review of Systems  Constitutional: Negative.   HENT: Negative.   Eyes: Negative.   Respiratory: Negative.   Cardiovascular: Negative.   Gastrointestinal: Negative.   Endocrine: Negative.   Genitourinary: Negative.   Musculoskeletal: Negative.   Skin: Negative.   Allergic/Immunologic: Negative.   Neurological: Negative.   Hematological: Negative.   Psychiatric/Behavioral: Negative.     PHYSICAL EXAMINATION:    BP 114/72 (BP Location: Right Arm, Patient Position: Sitting, Cuff Size: Normal)   Pulse 68   Resp 16   Ht 5\' 8"  (1.727 m)   Wt 171 lb (77.6 kg)   LMP 09/10/2018   BMI 26.00 kg/m     General appearance: alert, cooperative and appears stated age   Chaperone was present for exam.  ASSESSMENT  Menopausal symptoms.  Perimenopausal female. Mirena IUD.  Rare menses.  PLAN  We discussed options for tx of symptoms - HRT (addiding estrogen), Gabapentin, and SSRIs. She prefers estrogen.   Will use Vivelle Dot generic 0.05 mg twice weekly. Rx for 3 months total.  We reviewed WHI and potential risks of DVT, PE, MI, stroke and breast cancer. Return in 2 months for a well woman visit.    An After Visit Summary was printed and given to the patient.  __15____ minutes face to face time of which over 50% was spent in counseling.

## 2018-12-25 ENCOUNTER — Ambulatory Visit (INDEPENDENT_AMBULATORY_CARE_PROVIDER_SITE_OTHER): Payer: Commercial Managed Care - PPO | Admitting: Obstetrics and Gynecology

## 2018-12-25 ENCOUNTER — Encounter: Payer: Self-pay | Admitting: Obstetrics and Gynecology

## 2018-12-25 VITALS — BP 114/72 | HR 68 | Resp 16 | Ht 68.0 in | Wt 171.0 lb

## 2018-12-25 DIAGNOSIS — N951 Menopausal and female climacteric states: Secondary | ICD-10-CM

## 2018-12-25 MED ORDER — ACYCLOVIR 200 MG PO CAPS: ORAL_CAPSULE | ORAL | 2 refills | Status: DC

## 2018-12-25 MED ORDER — ESTRADIOL 0.05 MG/24HR TD PTTW: 1.0000 | MEDICATED_PATCH | TRANSDERMAL | 3 refills | Status: DC

## 2019-01-24 ENCOUNTER — Other Ambulatory Visit: Payer: Self-pay | Admitting: Obstetrics and Gynecology

## 2019-03-14 ENCOUNTER — Ambulatory Visit: Payer: Commercial Managed Care - PPO | Admitting: Obstetrics and Gynecology

## 2019-05-19 ENCOUNTER — Other Ambulatory Visit: Payer: Self-pay | Admitting: Obstetrics and Gynecology

## 2019-05-19 NOTE — Telephone Encounter (Signed)
Medication refill request: Vivelle Dot  Last AEX:  12/25/18 Ov Next AEX: nothing scheduled at this time.  Last MMG (if hormonal medication request): 08/16/2018  Bi-rads 1 neg  Refill authorized: #8 with 3 RF Please advise.

## 2019-09-30 DIAGNOSIS — Z975 Presence of (intrauterine) contraceptive device: Secondary | ICD-10-CM | POA: Insufficient documentation

## 2019-09-30 DIAGNOSIS — M5136 Other intervertebral disc degeneration, lumbar region: Secondary | ICD-10-CM | POA: Insufficient documentation

## 2019-09-30 DIAGNOSIS — B001 Herpesviral vesicular dermatitis: Secondary | ICD-10-CM | POA: Insufficient documentation

## 2019-09-30 DIAGNOSIS — N951 Menopausal and female climacteric states: Secondary | ICD-10-CM | POA: Insufficient documentation

## 2019-09-30 DIAGNOSIS — M5137 Other intervertebral disc degeneration, lumbosacral region: Secondary | ICD-10-CM | POA: Insufficient documentation

## 2019-10-01 DIAGNOSIS — R2681 Unsteadiness on feet: Secondary | ICD-10-CM | POA: Insufficient documentation

## 2019-10-01 DIAGNOSIS — N289 Disorder of kidney and ureter, unspecified: Secondary | ICD-10-CM | POA: Insufficient documentation

## 2019-11-24 ENCOUNTER — Other Ambulatory Visit: Payer: Self-pay

## 2019-11-25 NOTE — Progress Notes (Signed)
48 y.o. G30P2002 Married  Caucasian Fe here for annual exam. Periods none with IUD. Denies warning signs with Mirena IUD use. Continues with Estrogen patch which is helping with hot flashes. Desires Rx refill. Working as NP and staying busy. Sees PCP for aex, labs. Has had some ear issues and throat issues with mask use. No infections. No enlarged lymph nodes that she is aware of. Recent colonoscopy which was negative and declines rectal exam today. Social stress with relative living with them currently. No other health issues today.   No LMP recorded. (Menstrual status: IUD). Mirena IUD in 10/ 2016  Sexually active: Yes.    The current method of family planning is IUD.    Exercising: Yes.    walking & running Smoker:  no  Review of Systems  Constitutional: Negative.   HENT: Negative.   Eyes: Negative.   Respiratory: Negative.   Cardiovascular: Negative.   Gastrointestinal: Negative.   Genitourinary: Negative.   Musculoskeletal: Negative.   Skin: Negative.   Neurological: Negative.   Endo/Heme/Allergies: Negative.   Psychiatric/Behavioral: Negative.     Health Maintenance: Pap:  08-30-15 neg HPV HRneg History of Abnormal Pap: yes CIN1 at age 56 MMG:  08-29-2019 category c density birads 1:neg Self Breast exams: occ Colonoscopy: 2020 f/u 31yrs BMD:  none TDaP: 2014 Shingles: not done Pneumonia: not done Hep C and HIV: both neg per patient Labs: if needed.   reports that she has never smoked. She has never used smokeless tobacco. She reports previous alcohol use. She reports that she does not use drugs.  Past Medical History:  Diagnosis Date  . 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 Anxiety     History reviewed. No pertinent surgical history.  Current Outpatient Medications  Medication Sig Dispense Refill  . acyclovir (ZOVIRAX) 200 MG capsule Take 4 capsules twice a day for up to 5 days. 30 capsule 2  . estradiol (VIVELLE-DOT)  0.05 MG/24HR patch APPLY 1 PATCH(0.05 MG) EXTERNALLY TO THE SKIN 2 TIMES A WEEK 8 patch 0  . levonorgestrel (MIRENA) 20 MCG/24HR IUD 1 each by Intrauterine route once.     No current facility-administered medications for this visit.    Family History  Problem Relation Age of Onset  . Hypertension Mother   . Diabetes Mother   . Hypertension Father   . Melanoma Father   . Breast cancer Paternal Grandmother 17  . Hypertension Paternal Grandmother   . Diabetes Maternal Grandmother   . Hypertension Maternal Grandmother   . Stroke Maternal Grandmother   . Diabetes Maternal Grandfather   . Hypertension Maternal Grandfather   . Stroke Maternal Grandfather   . Hypertension Paternal Grandfather     ROS:  Pertinent items are noted in HPI.  Otherwise, a comprehensive ROS was negative.  Exam:   BP 120/70   Pulse 70   Temp (!) 97.1 F (36.2 C) (Skin)   Resp 16   Ht 5' 7.75" (1.721 m)   Wt 180 lb (81.6 kg)   BMI 27.57 kg/m  Height: 5' 7.75" (172.1 cm) Ht Readings from Last 3 Encounters:  11/26/19 5' 7.75" (1.721 m)  12/25/18 5\' 8"  (1.727 m)  09/28/17 5\' 8"  (1.727 m)    General appearance: alert, cooperative and appears stated age Head: Normocephalic, without obvious abnormality, atraumatic Neck: no adenopathy, supple, symmetrical, trachea midline and thyroid normal to inspection and palpation, right cervical lymph mildly enlarged, non tender Lungs: clear  to auscultation bilaterally Breasts: normal appearance, no masses or tenderness, No nipple retraction or dimpling, No nipple discharge or bleeding, No axillary or supraclavicular adenopathy Heart: regular rate and rhythm Abdomen: soft, non-tender; no masses,  no organomegaly Extremities: extremities normal, atraumatic, no cyanosis or edema Skin: Skin color, texture, turgor normal. No rashes or lesions Lymph nodes: Cervical, supraclavicular, and axillary nodes normal. No abnormal inguinal nodes palpated Neurologic: Grossly  normal   Pelvic: External genitalia:  no lesions              Urethra:  normal appearing urethra with no masses, tenderness or lesions              Bartholin's and Skene's: normal                 Vagina: normal appearing vagina with normal color and discharge, no lesions              Cervix: multiparous appearance, no cervical motion tenderness and no lesions              Pap taken: Yes.   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 Desires continuance.  Rx Vivelle Dot 0.05mg /24 hr see order with instructions  Rx Zovirax see order with instructions  Discussed enlarged lymph node and recommended CBC. Patient prefers to do at work and will discuss with PCP if needed. Declines here.  Encouraged to take time out for self, which can help with other issues.  Pap smear: yes   counseled on breast self exam, mammography screening, STD prevention, menopause, adequate intake of calcium and vitamin D, diet and exercise  return annually or prn  An After Visit Summary was printed and given to the patient.

## 2019-11-26 ENCOUNTER — Other Ambulatory Visit (HOSPITAL_COMMUNITY)
Admission: RE | Admit: 2019-11-26 | Discharge: 2019-11-26 | Disposition: A | Discharge status: Home / Self Care | Payer: Commercial Managed Care - PPO | Source: Ambulatory Visit | Attending: Certified Nurse Midwife | Admitting: Certified Nurse Midwife

## 2019-11-26 ENCOUNTER — Encounter: Payer: Self-pay | Admitting: Certified Nurse Midwife

## 2019-11-26 ENCOUNTER — Other Ambulatory Visit: Payer: Self-pay

## 2019-11-26 ENCOUNTER — Ambulatory Visit (INDEPENDENT_AMBULATORY_CARE_PROVIDER_SITE_OTHER): Payer: Commercial Managed Care - PPO | Admitting: Certified Nurse Midwife

## 2019-11-26 VITALS — BP 120/70 | HR 70 | Temp 97.1°F | Resp 16 | Ht 67.75 in | Wt 180.0 lb

## 2019-11-26 DIAGNOSIS — N951 Menopausal and female climacteric states: Secondary | ICD-10-CM | POA: Diagnosis not present

## 2019-11-26 DIAGNOSIS — Z01419 Encounter for gynecological examination (general) (routine) without abnormal findings: Secondary | ICD-10-CM | POA: Diagnosis not present

## 2019-11-26 DIAGNOSIS — Z30431 Encounter for routine checking of intrauterine contraceptive device: Secondary | ICD-10-CM

## 2019-11-26 DIAGNOSIS — B009 Herpesviral infection, unspecified: Secondary | ICD-10-CM

## 2019-11-26 DIAGNOSIS — Z124 Encounter for screening for malignant neoplasm of cervix: Secondary | ICD-10-CM | POA: Diagnosis not present

## 2019-11-26 MED ORDER — ACYCLOVIR 200 MG PO CAPS: ORAL_CAPSULE | ORAL | 4 refills | Status: AC

## 2019-11-26 MED ORDER — ESTRADIOL 0.05 MG/24HR TD PTTW: MEDICATED_PATCH | TRANSDERMAL | 11 refills | Status: DC

## 2019-11-26 NOTE — Patient Instructions (Signed)

## 2019-11-27 LAB — CYTOLOGY - PAP
Diagnosis: NEGATIVE
Diagnosis: REACTIVE

## 2019-11-28 NOTE — Progress Notes (Signed)
Pap smear negative 02

## 2020-03-03 ENCOUNTER — Encounter: Payer: Self-pay | Admitting: Certified Nurse Midwife

## 2020-04-09 DIAGNOSIS — R1013 Epigastric pain: Secondary | ICD-10-CM | POA: Diagnosis not present

## 2020-05-06 DIAGNOSIS — B379 Candidiasis, unspecified: Secondary | ICD-10-CM | POA: Diagnosis not present

## 2020-05-06 DIAGNOSIS — A048 Other specified bacterial intestinal infections: Secondary | ICD-10-CM | POA: Diagnosis not present

## 2020-06-01 DIAGNOSIS — A048 Other specified bacterial intestinal infections: Secondary | ICD-10-CM | POA: Diagnosis not present

## 2020-09-13 DIAGNOSIS — L821 Other seborrheic keratosis: Secondary | ICD-10-CM | POA: Diagnosis not present

## 2020-09-13 DIAGNOSIS — D225 Melanocytic nevi of trunk: Secondary | ICD-10-CM | POA: Diagnosis not present

## 2020-09-30 DIAGNOSIS — Z1231 Encounter for screening mammogram for malignant neoplasm of breast: Secondary | ICD-10-CM | POA: Diagnosis not present

## 2020-09-30 DIAGNOSIS — Z1239 Encounter for other screening for malignant neoplasm of breast: Secondary | ICD-10-CM | POA: Diagnosis not present

## 2020-11-24 ENCOUNTER — Ambulatory Visit: Payer: Commercial Managed Care - PPO | Admitting: Family Medicine

## 2020-11-24 ENCOUNTER — Other Ambulatory Visit: Payer: Self-pay

## 2020-11-24 ENCOUNTER — Ambulatory Visit (INDEPENDENT_AMBULATORY_CARE_PROVIDER_SITE_OTHER): Payer: BC Managed Care – PPO | Admitting: Family Medicine

## 2020-11-24 ENCOUNTER — Encounter: Payer: Self-pay | Admitting: Family Medicine

## 2020-11-24 VITALS — BP 102/72 | Ht 68.0 in | Wt 170.0 lb

## 2020-11-24 DIAGNOSIS — M722 Plantar fascial fibromatosis: Secondary | ICD-10-CM | POA: Diagnosis not present

## 2020-11-24 NOTE — Patient Instructions (Signed)
You have plantar fasciitis Take tylenol and/or aleve as needed for pain  Plantar fascia stretch for 20-30 seconds (do 3 of these) in morning and night Lowering/raise on a step exercises 3 x 10 once or twice a day - this is very important for long term recovery. Can add heel walks, toe walks forward and backward as well Ice heel for 15 minutes as needed. Avoid flat shoes/barefoot walking as much as possible. Arch straps have been shown to help with pain. Use the [place] sock when sleeping especially on the left. Inserts are important (dr. Jari Sportsman active series, spencos, our green insoles, custom orthotics). Steroid injection is a consideration for short term pain relief if you are struggling. Physical therapy is also an option. Follow up with me in 5-6 weeks for reevaluation.

## 2020-11-24 NOTE — Progress Notes (Signed)
PCP: Bailey Mech, PA-C  Subjective:   HPI: Patient is a 49 y.o. female here for bilateral heel pain for the last 6 months.  Patient reports that she used to run 1.8 miles 7 times a week but noticed bilateral heel pain worse on the left than the right starting in June 2021.  She continued to run through September but weaned the amount of running she was doing.  She reports that she was able to run maybe 1 or 2 times a week in October and then had to start walking.  At this time she is only walking her dog because the pain is so severe in her heels.  Denies any traumatic injury to her heels.  Reports that her husband has history of plantar fasciitis so she has been doing all of the stretches and treatment that he was given with little relief.  She also bought new shoes to help with the padding but has seen little improvement with those and has inserts to help with arch support.  Has been doing home exercises (eccentrics).  Past Medical History:  Diagnosis Date  . 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 Anxiety     Current Outpatient Medications on File Prior to Visit  Medication Sig Dispense Refill  . acyclovir (ZOVIRAX) 200 MG capsule Take 4 capsules twice a day for up to 5 days. 30 capsule 4  . estradiol (VIVELLE-DOT) 0.05 MG/24HR patch APPLY 1 PATCH(0.05 MG) EXTERNALLY TO THE SKIN 2 TIMES A WEEK 8 patch 11  . levonorgestrel (MIRENA) 20 MCG/24HR IUD 1 each by Intrauterine route once.     No current facility-administered medications on file prior to visit.    Past Surgical History:  Procedure Laterality Date  . INTRAUTERINE DEVICE INSERTION     mirena inserted 11-16-15    No Known Allergies  Social History   Socioeconomic History  . Marital status: Married    Spouse name: Not on file  . Number of children: Not on file  . Years of education: Not on file  . Highest education level: Not on file  Occupational History  .  Not on file  Tobacco Use  . Smoking status: Never Smoker  . Smokeless tobacco: Never Used  Vaping Use  . Vaping Use: Never used  Substance and Sexual Activity  . Alcohol use: Not Currently  . Drug use: No  . Sexual activity: Yes    Partners: Male    Birth control/protection: I.U.D.    Comment: Mirena inserted 11-19-15  Other Topics Concern  . Not on file  Social History Narrative  . Not on file   Social Determinants of Health   Financial Resource Strain: Not on file  Food Insecurity: Not on file  Transportation Needs: Not on file  Physical Activity: Not on file  Stress: Not on file  Social Connections: Not on file  Intimate Partner Violence: Not on file    Family History  Problem Relation Age of Onset  . Hypertension Mother   . Diabetes Mother   . Hypertension Father   . Melanoma Father   . Breast cancer Paternal Grandmother 91  . Hypertension Paternal Grandmother   . Diabetes Maternal Grandmother   . Hypertension Maternal Grandmother   . Stroke Maternal Grandmother   . Diabetes Maternal Grandfather   . Hypertension Maternal Grandfather   . Stroke Maternal Grandfather   . Hypertension Paternal Grandfather     BP 102/72  Ht 5\' 8"  (1.727 m)   Wt 170 lb (77.1 kg)   BMI 25.85 kg/m   [facility] 11/24/2020  Frequency of aerobic exercise (# of days/week) 7  Average time in minutes 20  Frequency of strengthening activities (# of days/week) 0    No flowsheet data found.  Review of Systems: See HPI above.     Objective:  Physical Exam:  Gen: NAD, comfortable in exam room  Ankle/Foot, bilateral: No visible erythema, swelling, ecchymosis, or bony deformity. No notable pes planus/cavus deformity. Transverse arch mild collapse with splaying; No evidence of tibiotalar deviation; Range of motion is full in all directions. Strength is 5/5 in all directions. No tenderness at the insertion/body/myotendinous junction of the Achilles tendon;   Unremarkable calcaneal squeeze; considerable plantar calcaneal tenderness at plantar fascia insertion medially; No tenderness at the distal metatarsals; Able to walk 4 steps.  Negative ant drawer, talar tilt.  Ultrasound showed thickened plantar fascia bilaterally at insertion on calcaneus.  Assessment & Plan:  Bilateral heel pain History and physical exam consistent with plantar fasciitis.  Recommended Tylenol or Aleve for pain.  Provided instructions for stretches as well as exercises.  Recommended icing as needed.  Provided the patient with arch straps as well as recommended Strassburg sock when sleeping.  Also recommended continuation of inserts.  Consider steroid injections for short-term relief if needed in the future.  Follow-up in 5-6 weeks.

## 2020-12-15 ENCOUNTER — Ambulatory Visit: Payer: Self-pay | Admitting: Family Medicine

## 2020-12-16 ENCOUNTER — Other Ambulatory Visit: Payer: Self-pay

## 2020-12-16 DIAGNOSIS — N951 Menopausal and female climacteric states: Secondary | ICD-10-CM

## 2020-12-16 MED ORDER — ESTRADIOL 0.05 MG/24HR TD PTTW: MEDICATED_PATCH | TRANSDERMAL | 0 refills | Status: DC

## 2020-12-16 NOTE — Telephone Encounter (Signed)
Patient has mirena IUD for endometrial protection with ERT

## 2020-12-16 NOTE — Telephone Encounter (Signed)
Patient: Caroline Crane, NP Medication refill request: Lyllana 0.05mg  patch Last AEX:  11-26-19 Next AEX: not scheduled Last MMG (if hormonal medication request): 01-01-2020 category c density birads 1:neg ([facility]) Refill authorized: please approve if appropriate. Pharmacy note states patient will need to schedule annual for further refills.

## 2021-01-05 ENCOUNTER — Other Ambulatory Visit: Payer: Self-pay

## 2021-01-05 ENCOUNTER — Ambulatory Visit (INDEPENDENT_AMBULATORY_CARE_PROVIDER_SITE_OTHER): Payer: 59 | Admitting: Family Medicine

## 2021-01-05 ENCOUNTER — Ambulatory Visit: Payer: BC Managed Care – PPO | Admitting: Family Medicine

## 2021-01-05 ENCOUNTER — Encounter: Payer: Self-pay | Admitting: Family Medicine

## 2021-01-05 VITALS — BP 110/72 | Ht 68.0 in | Wt 180.0 lb

## 2021-01-05 DIAGNOSIS — M722 Plantar fascial fibromatosis: Secondary | ICD-10-CM | POA: Diagnosis not present

## 2021-01-05 NOTE — Patient Instructions (Signed)
You have plantar fasciitis Take tylenol and ibuprofen as you have been Plantar fascia stretch for 20-30 seconds (do 3 of these) in morning and night Lowering/raise on a step exercises 3 x 10 once or twice a day - this is very important for long term recovery. Ice heel for 15 minutes as needed. Avoid flat shoes/barefoot walking as much as possible - continue with the inserts you have, these are good ones. Start physical therapy for modalities including iontophoresis. Use the strassburg sock when sleeping especially on the left. Consider steroid injection on the left - call me if you want to go ahead with this. Assuming this slowly improves follow up with me in 5-6 weeks.

## 2021-01-05 NOTE — Progress Notes (Signed)
PCP: Bailey Mech, PA-C  Subjective:   HPI: Patient is a 50 y.o. female here for bilateral heel pain.  11/24/20: Patient reports that she used to run 1.8 miles 7 times a week but noticed bilateral heel pain worse on the left than the right starting in June 2021.  She continued to run through September but weaned the amount of running she was doing.  She reports that she was able to run maybe 1 or 2 times a week in October and then had to start walking.  At this time she is only walking her dog because the pain is so severe in her heels.  Denies any traumatic injury to her heels.  Reports that her husband has history of plantar fasciitis so she has been doing all of the stretches and treatment that he was given with little relief.  She also bought new shoes to help with the padding but has seen little improvement with those and has inserts to help with arch support.  Has been doing home exercises (eccentrics).  01/05/21: Patient reports she's continued to struggle with heel pain bilaterally since last visit. Doing home exercises, stretches. Using strassburg sock. Past couple days however with tylenol and ibuprofen has noticed some benefit. Right heel seems better but left bothering her a lot. Wearing OTC orthotics.  Past Medical History:  Diagnosis Date  . 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 Anxiety     Current Outpatient Medications on File Prior to Visit  Medication Sig Dispense Refill  . acyclovir (ZOVIRAX) 200 MG capsule Take 4 capsules twice a day for up to 5 days. 30 capsule 4  . estradiol (VIVELLE-DOT) 0.05 MG/24HR patch APPLY 1 PATCH(0.05 MG) EXTERNALLY TO THE SKIN 2 TIMES A WEEK 24 patch 0  . levonorgestrel (MIRENA) 20 MCG/24HR IUD 1 each by Intrauterine route once.     No current facility-administered medications on file prior to visit.    Past Surgical History:  Procedure Laterality Date  . INTRAUTERINE DEVICE  INSERTION     mirena inserted 11-16-15    No Known Allergies  Social History   Socioeconomic History  . Marital status: Married    Spouse name: Not on file  . Number of children: Not on file  . Years of education: Not on file  . Highest education level: Not on file  Occupational History  . Not on file  Tobacco Use  . Smoking status: Never Smoker  . Smokeless tobacco: Never Used  Vaping Use  . Vaping Use: Never used  Substance and Sexual Activity  . Alcohol use: Not Currently  . Drug use: No  . Sexual activity: Yes    Partners: Male    Birth control/protection: I.U.D.    Comment: Mirena inserted 11-19-15  Other Topics Concern  . Not on file  Social History Narrative  . Not on file   Social Determinants of Health   Financial Resource Strain: Not on file  Food Insecurity: Not on file  Transportation Needs: Not on file  Physical Activity: Not on file  Stress: Not on file  Social Connections: Not on file  Intimate Partner Violence: Not on file    Family History  Problem Relation Age of Onset  . Hypertension Mother   . Diabetes Mother   . Hypertension Father   . Melanoma Father   . Breast cancer Paternal Grandmother 47  . Hypertension Paternal Grandmother   . Diabetes Maternal  Grandmother   . Hypertension Maternal Grandmother   . Stroke Maternal Grandmother   . Diabetes Maternal Grandfather   . Hypertension Maternal Grandfather   . Stroke Maternal Grandfather   . Hypertension Paternal Grandfather     BP 110/72   Ht 5\' 8"  (1.727 m)   Wt 180 lb (81.6 kg)   BMI 27.37 kg/m   [facility] Adult Exercise 11/24/2020  Frequency of aerobic exercise (# of days/week) 7  Average time in minutes 20  Frequency of strengthening activities (# of days/week) 0    No flowsheet data found.  Review of Systems: See HPI above.     Objective:  Physical Exam:  Gen: NAD, comfortable in exam room  Bilateral feet/ankles: No gross deformity, swelling,  ecchymoses FROM without pain. No TTP on right.  TTP plantar fascia insertion on left. Negative ant drawer and negative talar tilt.   Negative syndesmotic and calcaneus compression. Thompsons test negative. NV intact distally.   Assessment & Plan:  1. Bilateral plantar fasciitis - right improved but left still causing limitations.  Continue home exercises.  Tylenol, ibuprofen.  Strassburg sock.  Add formal physical therapy.  Consider injection on left if not improving.  Continue otc orthotics.  F/u in 5-6 weeks.

## 2021-01-19 ENCOUNTER — Encounter: Payer: Self-pay | Admitting: Physical Therapy

## 2021-01-19 ENCOUNTER — Other Ambulatory Visit: Payer: Self-pay

## 2021-01-19 ENCOUNTER — Ambulatory Visit: Payer: 59 | Attending: Family Medicine | Admitting: Physical Therapy

## 2021-01-19 DIAGNOSIS — M79671 Pain in right foot: Secondary | ICD-10-CM | POA: Diagnosis not present

## 2021-01-19 DIAGNOSIS — R29898 Other symptoms and signs involving the musculoskeletal system: Secondary | ICD-10-CM | POA: Insufficient documentation

## 2021-01-19 DIAGNOSIS — M79672 Pain in left foot: Secondary | ICD-10-CM | POA: Insufficient documentation

## 2021-01-19 DIAGNOSIS — R262 Difficulty in walking, not elsewhere classified: Secondary | ICD-10-CM | POA: Insufficient documentation

## 2021-01-19 NOTE — Therapy (Addendum)
[facility] [address]  [place], [place], [address] Phone: [phone]   Fax:  [phone]  Physical Therapy Evaluation  Patient Details  Name: Caroline Roberts MRN: [id number] Date of Birth: Jul 12, 1971 Referring Provider (PT): Karlton Lemon, MD   Encounter Date: 01/19/2021   PT End of Session - 01/19/21 1126    Visit Number 1    Number of Visits 7    Date for PT Re-Evaluation 03/02/21    Authorization Type [facility]    PT Start Time 1019    PT Stop Time 1111    PT Time Calculation (min) 52 min    Activity Tolerance Patient limited by pain;Patient tolerated treatment well    Behavior During Therapy [facility] for tasks assessed/performed           Past Medical History:  Diagnosis Date  . 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 Anxiety     Past Surgical History:  Procedure Laterality Date  . INTRAUTERINE DEVICE INSERTION     mirena inserted 11-16-15    There were no vitals filed for this visit.    Subjective Assessment - 01/19/21 1025    Subjective Patient reports that she used to run 6 days a week and would like to return to this activity, however has been limited by B heel pain since June. Pain occurs in the central heel, L>R. Reports that she has tried several different treatments and exercises. Currently wearing Strassburg sock, using her massage gun, and performing heel raises on step and toe stretches. Stopped running in October but tried a run in November which set her back. Recently has been getting better with use of ibuprofen, yoga stretches. Worse first thing in AM, with WBing, walking, running.    Pertinent History anxiety    Limitations Walking;House hold activities    How long can you sit comfortably? sometimes occurs at rest    How long can you stand comfortably? variable    How long can you walk comfortably? variable    Diagnostic tests Per patient- US  revealed thickening of plantar fascia but no bone spur    Patient Stated Goals learn exercise to improve pain    Currently in Pain? Yes    Pain Score 3     Pain Location Heel    Pain Orientation Left;Right    Pain Descriptors / Indicators Sharp    Pain Type Chronic pain              [facility] PT Assessment - 01/19/21 1029      Assessment   Medical Diagnosis B plantar fasciitis    Referring Provider (PT) Karlton Lemon, MD    Onset Date/Surgical Date 05/11/20    Next MD Visit not scheduled    Prior Therapy no      Precautions   Precautions None      Balance Screen   Has the patient fallen in the past 6 months No    Has the patient had a decrease in activity level because of a fear of falling?  No    Is the patient reluctant to leave their home because of a fear of falling?  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      Cognition   Overall Cognitive Status Within Functional Limits for tasks assessed      Sensation   Light Touch Appears Intact   reporting intermitent burning in L lateral calf     Coordination   Gross Motor Movements are Fluid and Coordinated Yes      Posture/Postural Control   Posture/Postural Control No significant limitations      ROM / Strength   AROM / PROM / Strength AROM;Strength      AROM   Overall AROM Comments B 1st digit MTP extension with ankle DF ~30deg PROM each    AROM Assessment Site Ankle    Right/Left Ankle Right;Left    Right Ankle Dorsiflexion 24    Right Ankle Plantar Flexion 69    Right Ankle Inversion 40    Right Ankle Eversion 19    Left Ankle Dorsiflexion 19   slight discomfort in arch   Left Ankle Plantar Flexion 67   pain in L medial arch   Left Ankle  Inversion 32    Left Ankle Eversion 15      Strength   Strength Assessment Site Hip;Knee;Ankle    Right/Left Hip Right;Left    Right Hip Flexion 4+/5    Right Hip Extension 4+/5    Right Hip ABduction 4+/5    Right Hip ADduction 4/5    Left Hip Flexion 4+/5    Left Hip Extension 4/5    Left Hip ABduction 4+/5    Left Hip ADduction 4+/5    Right/Left Knee Right;Left    Right Knee Flexion 4/5    Right Knee Extension 5/5    Left Knee Flexion 4/5    Left Knee Extension 5/5    Right/Left Ankle Left;Right    Right Ankle Dorsiflexion 4+/5    Right Ankle Plantar Flexion 5/5   20 reps   Right Ankle Inversion 4/5    Right Ankle Eversion 4/5    Left Ankle Dorsiflexion 4+/5    Left Ankle Inversion 4/5    Left Ankle Eversion 4/5      Palpation   Palpation comment no TTP over B posterior calf without soft tissue restriction; soft tissue restriction along B plantar aspect of feet with point tenderness over central heel      Ambulation/Gait   Assistive device None    Gait Pattern Within Functional Limits                      Objective measurements completed on examination: See above findings.               PT Education - 01/19/21 1123    Education Details prognosis, POC, HEP with modifications for comfort; extensive edu provided on benefits of toe extension + dorsiflexion stretching to offload plantar fascia; discussed potential therapy treatment options includin various modalities; encouraged patient to try HEP before returning to therapy/MD d/t patient's c/o high deductible    Person(s) Educated Patient    Methods Explanation;Demonstration;Tactile cues;Verbal cues;Handout    Comprehension Verbalized understanding;Returned demonstration            PT Short Term Goals - 01/19/21 1134      PT SHORT TERM GOAL #1   Title Patient to be independent with initial HEP.    Time 3    Period Weeks    Status  New    Target Date 02/09/21             PT Long  Term Goals - 01/19/21 1134      PT LONG TERM GOAL #1   Title Patient to be independent with advanced HEP.    Time 6    Period Weeks    Status New    Target Date 03/02/21      PT LONG TERM GOAL #2   Title Patient to demonstrate B great toe extension with ankle in dorsiflexion PROM to 70 degrees.    Time 6    Period Weeks    Status New    Target Date 03/02/21      PT LONG TERM GOAL #3   Title Patient to demonstrate B LE strength >/=4+/5.    Time 6    Period Weeks    Status New    Target Date 03/02/21      PT LONG TERM GOAL #4   Title Patient to report tolerance for 3 hours of being on her feet before onset of pain.    Time 6    Period Weeks    Status New    Target Date 03/02/21      PT LONG TERM GOAL #5   Title Patient to report tolerance for 1 mile run without pain.    Time 6    Period Weeks    Status New    Target Date 03/02/21                  Plan - 01/19/21 1131    Clinical Impression Statement Patient is a 50 y/o F presenting to OPPT with c/o chronic B foot pain since June 2021 without acute injury. Patient is active and would like to return to running, but unable d/t pain at this time. Notes that she has tried exercises, Strassburg sock, machine gun, and Ibuprofen with limited improvement. Pain worse L vs. R as well as in the AM, with walking, running, and sometimes pain occurs at rest. Patient today presenting with painful L ankle dorsiflexion and plantarflexion, very slightly limited ankle eversion ROM, limited great toe extension ROM, B ankle inversion and eversion weakness, and soft tissue restriction along B plantar aspect of feet with point tenderness over central heel. Patient was educated on gentle stretching and strengthening HEP with modifications provided to improve comfort- patient reported understanding. Would benefit from skilled PT services 1x/week for 6 weeks to address aforementioned impairments.    Personal Factors and Comorbidities  Age;Comorbidity 1;Fitness;Past/Current Experience;Profession;Time since onset of injury/illness/exacerbation    Comorbidities anxiety    Examination-Activity Limitations Stand;Locomotion Level   running   Examination-Participation Restrictions Occupation;Yard Work;Community Activity;Shop;Church    Stability/Clinical Decision Making Stable/Uncomplicated    Clinical Decision Making Low    Rehab Potential Good    PT Frequency 1x / week    PT Duration 6 weeks    PT Treatment/Interventions ADLs/Self Care Home Management;Cryotherapy;Electrical Stimulation;Iontophoresis 59m/ml Dexamethasone;Moist Heat;Balance training;Therapeutic exercise;Therapeutic activities;Functional mobility training;Stair training;Gait training;Ultrasound;Neuromuscular re-education;Patient/family education;Manual techniques;Vasopneumatic Device;Taping;Energy conservation;Dry needling;Passive range of motion    PT Next Visit Plan foot FOTO; progress great toe mobility, calf stretching, modalities as needed    Consulted and Agree with Plan of Care Patient           Patient will benefit from skilled therapeutic intervention in order to improve the following deficits and impairments:  Decreased activity tolerance,Decreased strength,Increased fascial restricitons,Pain,Decreased balance,Difficulty walking,Decreased range of motion,Impaired flexibility  Visit Diagnosis: Pain  in left foot  Pain in right foot  Difficulty in walking, not elsewhere classified  Other symptoms and signs involving the musculoskeletal system     Problem List Patient Active Problem List   Diagnosis Date Noted  . Gait instability 10/01/2019  . Renal insufficiency, mild 10/01/2019  . Bulging lumbar disc 09/30/2019  . DDD (degenerative disc disease), lumbosacral 09/30/2019  . Fever blister 09/30/2019  . IUD (intrauterine device) in place 09/30/2019  . Menopausal symptoms 09/30/2019  . Left hip pain 10/04/2017  . LLQ pain 07/12/2017  . Left  ankle injury 06/24/2012     Janene Harvey, PT, DPT 01/19/21 11:40 AM   [facility] [address]  [place], [place], [address] Phone: [phone]   Fax:  [phone]  Name: Caroline Roberts MRN: [id number] Date of Birth: 1971/07/24   PHYSICAL THERAPY DISCHARGE SUMMARY  Visits from Start of Care: 1  Current functional level related to goals / functional outcomes: See above clinical impression   Remaining deficits: See above   Education / Equipment: HEP  Plan: Patient agrees to discharge.  Patient goals were not met. Patient is being discharged due to not returning since the last visit.  ?????     Janene Harvey, PT, DPT 02/24/21 1:35 PM

## 2021-03-09 ENCOUNTER — Ambulatory Visit (INDEPENDENT_AMBULATORY_CARE_PROVIDER_SITE_OTHER): Payer: 59 | Admitting: Obstetrics & Gynecology

## 2021-03-09 ENCOUNTER — Other Ambulatory Visit (HOSPITAL_COMMUNITY): Payer: Self-pay | Admitting: Obstetrics & Gynecology

## 2021-03-09 ENCOUNTER — Encounter: Payer: Self-pay | Admitting: Obstetrics & Gynecology

## 2021-03-09 ENCOUNTER — Other Ambulatory Visit: Payer: Self-pay

## 2021-03-09 VITALS — BP 101/75 | HR 67 | Ht 68.0 in | Wt 185.0 lb

## 2021-03-09 DIAGNOSIS — Z01419 Encounter for gynecological examination (general) (routine) without abnormal findings: Secondary | ICD-10-CM

## 2021-03-09 DIAGNOSIS — N951 Menopausal and female climacteric states: Secondary | ICD-10-CM

## 2021-03-09 MED ORDER — ESTRADIOL 0.05 MG/24HR TD PTTW: 1.0000 | MEDICATED_PATCH | TRANSDERMAL | 12 refills | Status: DC

## 2021-03-09 MED FILL — ESTRADIOL 0.05 MG PATCH: 0.05 | 28 days supply | Qty: 8 | Fill #0

## 2021-03-09 NOTE — Addendum Note (Signed)
Addended by: Willodean Rosenthal on: 03/09/2021 02:09 PM   Modules accepted: Orders

## 2021-03-09 NOTE — Addendum Note (Signed)
Addended by: Willodean Rosenthal on: 03/09/2021 04:42 PM   Modules accepted: Orders

## 2021-03-09 NOTE — Addendum Note (Signed)
Addended by: Lorelle Gibbs L on: 03/09/2021 12:59 PM   Modules accepted: Orders

## 2021-03-09 NOTE — Progress Notes (Addendum)
Subjective:     Caroline Roberts is a 50 y.o. female here for a routine exam.  Current complaints: No GYN complaints. Pt is UTD with her colon and breast cancer screening. 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 She is considering a switch in positions.  Pt is on HRT. She uses the TTS patch biweekly. She has not been able to get these covered without a large co-pay. Her sx return when she is not on the patch.      Gynecologic History No LMP recorded. (Menstrual status: IUD). Contraception: IUD Last Pap: 11/26/2019. Results were: normal Last mammogram: 09/2020. Results were: normal done at [facility]. Results in Care Everywhere.  Obstetric History OB History  Gravida Para Term Preterm AB Living  2 2 2     2   SAB IAB Ectopic Multiple Live Births          2    # Outcome Date GA Lbr Len/2nd Weight Sex Delivery Anes PTL Lv  2 Term 2003 101w0d   M Vag-Spont EPI N LIV  1 Term 2000 [redacted]w[redacted]d   M Vag-Spont EPI N LIV     The following portions of the patient's history were reviewed and updated as appropriate: allergies, current medications, past family history, past medical history, past social history, past surgical history and problem list.  Review of Systems Pertinent items are noted in HPI.    Objective:  BP 101/75   Pulse 67   Ht 5\' 8"  (1.727 m)   Wt 185 lb (83.9 kg)   BMI 28.13 kg/m  General Appearance:    Alert, cooperative, no distress, appears stated age  Head:    Normocephalic, without obvious abnormality, atraumatic  Eyes:    conjunctiva/corneas clear, EOM's intact, both eyes  Ears:    Normal external ear canals, both ears  Nose:   Nares normal, septum midline, mucosa normal, no drainage    or sinus tenderness  Throat:   Lips, mucosa, and tongue normal; teeth and gums normal  Neck:   Supple, symmetrical, trachea midline, no adenopathy;    thyroid:  no enlargement/tenderness/nodules  Back:     Symmetric, no curvature, ROM normal, no CVA tenderness  Lungs:     respirations unlabored   Chest Wall:    No tenderness or deformity   Heart:    Regular rate and rhythm  Breast Exam:    No tenderness, masses, or nipple abnormality  Abdomen:     Soft, non-tender, bowel sounds active all four quadrants,    no masses, no organomegaly  Genitalia:    Normal female without lesion, discharge or tenderness   IUD strings noted. Uterus small and mobile.   Extremities:   Extremities normal, atraumatic, no cyanosis or edema  Pulses:   2+ and symmetric all extremities  Skin:   Skin color, texture, turgor normal, no rashes or lesions     Assessment:    Healthy female exam.   UTD on all health maintenance HRT- wishes to cont to use the TTS. Understands the risks vs benefits.     Plan:    f/u in 1 year or sooner prn Annual mammogram  Will change to the generic of the Vivelle patch    Nikeisha Klutz L. Harraway-Smith, M.D., M

## 2021-04-20 ENCOUNTER — Ambulatory Visit (INDEPENDENT_AMBULATORY_CARE_PROVIDER_SITE_OTHER): Payer: 59 | Admitting: Family Medicine

## 2021-04-20 ENCOUNTER — Other Ambulatory Visit: Payer: Self-pay

## 2021-04-20 ENCOUNTER — Encounter: Payer: Self-pay | Admitting: Family Medicine

## 2021-04-20 VITALS — BP 104/68 | Ht 68.0 in | Wt 185.0 lb

## 2021-04-20 DIAGNOSIS — M722 Plantar fascial fibromatosis: Secondary | ICD-10-CM

## 2021-04-20 MED ORDER — METHYLPREDNISOLONE ACETATE 40 MG/ML IJ SUSP
40.0000 mg | Freq: Once | INTRAMUSCULAR | Status: AC
  Administered 2021-04-20: 40 mg via INTRA_ARTICULAR

## 2021-04-20 NOTE — Progress Notes (Signed)
PCP: Bailey Mech, PA-C  Subjective:   HPI: Patient is a 50 y.o. female here for bilateral heel pain.  11/24/20: Patient reports that she used to run 1.8 miles 7 times a week but noticed bilateral heel pain worse on the left than the right starting in June 2021.  She continued to run through September but weaned the amount of running she was doing.  She reports that she was able to run maybe 1 or 2 times a week in October and then had to start walking.  At this time she is only walking her dog because the pain is so severe in her heels.  Denies any traumatic injury to her heels.  Reports that her husband has history of plantar fasciitis so she has been doing all of the stretches and treatment that he was given with little relief.  She also bought new shoes to help with the padding but has seen little improvement with those and has inserts to help with arch support.  Has been doing home exercises ([employer]).  01/05/21: Patient reports she's continued to struggle with heel pain bilaterally since last visit. Doing home exercises, stretches. Using strassburg sock. Past couple days however with tylenol and ibuprofen has noticed some benefit. Right heel seems better but left bothering her a lot. Wearing OTC orthotics.  5/11: Patient returns with continued problems bilateral plantar heels. Left worse than right. Doing physical therapy, home exercises. She didn't notice much benefit using the strassburg sock. Pain worse first thing in the morning and with prolonged sitting. nsaids have helped when she takes them. Doing exercises and stretches.  Past Medical History:  Diagnosis Date  . 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 Anxiety     Current Outpatient Medications on File Prior to Visit  Medication Sig Dispense Refill  . acyclovir (ZOVIRAX) 200 MG capsule Take 4 capsules twice a day for up to 5 days. (Patient not taking: No sig  reported) 30 capsule 4  . estradiol (VIVELLE-DOT) 0.05 MG/24HR patch PLACE 1 PATCH (0.05 MG TOTAL) ONTO THE SKIN 2 (TWO) TIMES A WEEK. 8 patch 12  . levonorgestrel (MIRENA) 20 MCG/24HR IUD 1 each by Intrauterine route once.     No current facility-administered medications on file prior to visit.    Past Surgical History:  Procedure Laterality Date  . INTRAUTERINE DEVICE INSERTION     mirena inserted 11-16-15    No Known Allergies  Social History   Socioeconomic History  . Marital status: Married    Spouse name: Not on file  . Number of children: Not on file  . Years of education: Not on file  . Highest education level: Not on file  Occupational History  . Not on file  Tobacco Use  . Smoking status: Never Smoker  . Smokeless tobacco: Never Used  Vaping Use  . Vaping Use: Never used  Substance and Sexual Activity  . Alcohol use: Not Currently  . Drug use: No  . Sexual activity: Yes    Partners: Male    Birth control/protection: I.U.D.    Comment: Mirena inserted 11-19-15  Other Topics Concern  . Not on file  Social History Narrative  . Not on file   Social Determinants of Health   Financial Resource Strain: Not on file  Food Insecurity: Not on file  Transportation Needs: Not on file  Physical Activity: Not on file  Stress: Not on file  Social Connections:  Not on file  Intimate Partner Violence: Not on file    Family History  Problem Relation Age of Onset  . Hypertension Mother   . Diabetes Mother   . Hypertension Father   . Melanoma Father   . Breast cancer Paternal Grandmother 54  . Hypertension Paternal Grandmother   . Diabetes Maternal Grandmother   . Hypertension Maternal Grandmother   . Stroke Maternal Grandmother   . Diabetes Maternal Grandfather   . Hypertension Maternal Grandfather   . Stroke Maternal Grandfather   . Hypertension Paternal Grandfather     BP 104/68   Ht 5\' 8"  (1.727 m)   Wt 185 lb (83.9 kg)   BMI 28.13 kg/m   [facility] 11[facility]24[facility]  Frequency of aerobic exercise (# of days/week) 7  Average time in minutes 20  Frequency of strengthening activities (# of days/week) 0    No flowsheet data found.  Review of Systems: See HPI above.     Objective:  Physical Exam:  Gen: NAD, comfortable in exam room  Left foot/ankle: No gross deformity, swelling, ecchymoses FROM without pain TTP medial plantar fascia at insertion on calcaneus.  No other tenderness Negative calcaneal squeeze. Thompsons test negative. NV intact distally.  Limited MSK u/s left plantar fascia: thickness 0.83cm at insertion.   Assessment & Plan:  1. Bilateral plantar fasciitis - worse on left.  Has tried physical therapy, home exercises, otc medications including nsaids, otc orthotics, strassburg sock.  Injection given on left side today.  F/u in 5-6 weeks if she's improving, call 11/26/2020 sooner if she wants to do injection on right.  After informed written consent patient timeout was performed.  Patient was seated on exam table.  Area overlying left plantar fascia proximally prepped medially with alcohol swab.  Utilizing ultrasound guidance patient's left plantar fascia was injected with 1:1 lidocaine:depomedrol with multiple needle fenestrations.  Patient tolerated procedure well without immediate complications.

## 2021-04-20 NOTE — Addendum Note (Signed)
Addended by: Rutha Bouchard E on: 04/20/2021 02:42 PM   Modules accepted: Orders

## 2021-06-24 ENCOUNTER — Ambulatory Visit: Payer: Self-pay

## 2021-06-24 ENCOUNTER — Encounter: Payer: Self-pay | Admitting: Family Medicine

## 2021-06-24 ENCOUNTER — Other Ambulatory Visit: Payer: Self-pay

## 2021-06-24 ENCOUNTER — Ambulatory Visit (INDEPENDENT_AMBULATORY_CARE_PROVIDER_SITE_OTHER): Payer: Commercial Managed Care - PPO | Admitting: Family Medicine

## 2021-06-24 VITALS — BP 120/82 | Ht 68.0 in | Wt 180.0 lb

## 2021-06-24 DIAGNOSIS — M722 Plantar fascial fibromatosis: Secondary | ICD-10-CM | POA: Diagnosis not present

## 2021-06-24 MED ORDER — METHYLPREDNISOLONE ACETATE 40 MG/ML IJ SUSP
40.0000 mg | Freq: Once | INTRAMUSCULAR | Status: AC
  Administered 2021-06-24: 40 mg via INTRA_ARTICULAR

## 2021-06-24 NOTE — Assessment & Plan Note (Signed)
Left plantar fascia appears to be improving with right still acutely swollen and thick. -Counseled on home exercise therapy and supportive care. -Right plantar fashion injection today. -Can pursue left shockwave therapy

## 2021-06-24 NOTE — Patient Instructions (Signed)
Good to see you Please try ice  Please send me a message in MyChart with any questions or updates.  We can start the shockwave next week.   --Dr. Jordan Likes

## 2021-06-24 NOTE — Progress Notes (Signed)
Caroline Roberts - 50 y.o. female MRN [id number]  Date of birth: 11/23/1971  SUBJECTIVE:  Including CC & ROS.  No chief complaint on file.   Caroline Roberts is a 50 y.o. female that is presenting with bilateral Planter fasciitis.  She received a steroid injection in the left and that has improved but has still having pain.  The right is severe.  Pain is been present for about a year.  She is unable to run like she normally does.   Review of Systems See HPI   HISTORY: Past Medical, Surgical, Social, and Family History Reviewed & Updated per EMR.   Pertinent Historical Findings include:  Past Medical History:  Diagnosis Date   Abnormal Pap smear of cervix    --in her 20's had colposcopy--CIN I--no treatment   Anxiety     Past Surgical History:  Procedure Laterality Date   INTRAUTERINE DEVICE INSERTION     mirena inserted 11-16-15    Family History  Problem Relation Age of Onset   Hypertension Mother    Diabetes Mother    Hypertension Father    Melanoma Father    Breast cancer Paternal Grandmother 58   Hypertension Paternal Grandmother    Diabetes Maternal Grandmother    Hypertension Maternal Grandmother    Stroke Maternal Grandmother    Diabetes Maternal Grandfather    Hypertension Maternal Grandfather    Stroke Maternal Grandfather    Hypertension Paternal Grandfather     Social History   Socioeconomic History   Marital status: Married    Spouse name: Not on file   Number of children: Not on file   Years of education: Not on file   Highest education level: Not on file  Occupational History   Not on file  Tobacco Use   Smoking status: Never   Smokeless tobacco: Never  Vaping Use   Vaping Use: Never used  Substance and Sexual Activity   Alcohol use: Not Currently   Drug use: No   Sexual activity: Yes    Partners: Male    Birth control/protection: I.U.D.    Comment: Mirena inserted 11-19-15  Other Topics Concern   Not on file  Social History Narrative   Not  on file   Social Determinants of Health   Financial Resource Strain: Not on file  Food Insecurity: Not on file  Transportation Needs: Not on file  Physical Activity: Not on file  Stress: Not on file  Social Connections: Not on file  Intimate Partner Violence: Not on file     PHYSICAL EXAM:  VS: BP 120/82 (BP Location: Left Arm, Patient Position: Sitting, Cuff Size: Normal)   Ht 5\' 8"  (1.727 m)   Wt 180 lb (81.6 kg)   BMI 27.37 kg/m  Physical Exam Gen: NAD, alert, cooperative with exam, well-appearing MSK:  Right and left foot: Tenderness to palpation of the plantar calcaneus. Normal range of motion. Normal strength resistance. Neurovascularly intact  Limited ultrasound: Right foot, left foot:  Right foot: Thickening and widening of the plantar fascia at the origin.  Left foot: Plantar fascial is mildly thickened  Summary: Findings consistent with Planter fasciitis  Ultrasound and interpretation by , MD   Aspiration/Injection Procedure Note Clare Gandy 10/01/1971  Procedure: Injection Indications: Right foot pain  Procedure Details Consent: Risks of procedure as well as the alternatives and risks of each were explained to the (patient/caregiver).  Consent for procedure obtained. Time Out: Verified patient identification, verified procedure, site/side was marked,  verified correct patient position, special equipment/implants available, medications/allergies/relevent history reviewed, required imaging and test results available.  Performed.  The area was cleaned with iodine and alcohol swabs.    The right plantar fascia was injected using 1 cc's of 40 mg Depo-Medrol and 2 cc's of 0.25% bupivacaine with a 25 1 1/2" needle.  Ultrasound was used. Images were obtained in short views showing the injection.     A sterile dressing was applied.  Patient did tolerate procedure well.    ASSESSMENT & PLAN:   Bilateral plantar fasciitis Left plantar  fascia appears to be improving with right still acutely swollen and thick. -Counseled on home exercise therapy and supportive care. -Right plantar fashion injection today. -Can pursue left shockwave therapy

## 2021-07-07 DIAGNOSIS — Z1283 Encounter for screening for malignant neoplasm of skin: Secondary | ICD-10-CM | POA: Diagnosis not present

## 2021-07-07 DIAGNOSIS — D225 Melanocytic nevi of trunk: Secondary | ICD-10-CM | POA: Diagnosis not present

## 2021-07-07 DIAGNOSIS — B0089 Other herpesviral infection: Secondary | ICD-10-CM | POA: Diagnosis not present

## 2021-07-08 ENCOUNTER — Encounter: Payer: Self-pay | Admitting: Family Medicine

## 2021-07-08 ENCOUNTER — Ambulatory Visit: Payer: Self-pay | Admitting: Family Medicine

## 2021-07-08 ENCOUNTER — Other Ambulatory Visit: Payer: Self-pay

## 2021-07-08 DIAGNOSIS — M722 Plantar fascial fibromatosis: Secondary | ICD-10-CM

## 2021-07-08 NOTE — Progress Notes (Signed)
Caroline Roberts - 50 y.o. female MRN [id number]  Date of birth: 1971/10/09  SUBJECTIVE:  Including CC & ROS.  No chief complaint on file.   Caroline Roberts is a 50 y.o. female that is presenting for shockwave therapy.    Review of Systems See HPI   HISTORY: Past Medical, Surgical, Social, and Family History Reviewed & Updated per EMR.   Pertinent Historical Findings include:  Past Medical History:  Diagnosis Date   Abnormal Pap smear of cervix    --in her 20's had colposcopy--CIN I--no treatment   Anxiety     Past Surgical History:  Procedure Laterality Date   INTRAUTERINE DEVICE INSERTION     mirena inserted 11-16-15    Family History  Problem Relation Age of Onset   Hypertension Mother    Diabetes Mother    Hypertension Father    Melanoma Father    Breast cancer Paternal Grandmother 76   Hypertension Paternal Grandmother    Diabetes Maternal Grandmother    Hypertension Maternal Grandmother    Stroke Maternal Grandmother    Diabetes Maternal Grandfather    Hypertension Maternal Grandfather    Stroke Maternal Grandfather    Hypertension Paternal Grandfather     Social History   Socioeconomic History   Marital status: Married    Spouse name: Not on file   Number of children: Not on file   Years of education: Not on file   Highest education level: Not on file  Occupational History   Not on file  Tobacco Use   Smoking status: Never   Smokeless tobacco: Never  Vaping Use   Vaping Use: Never used  Substance and Sexual Activity   Alcohol use: Not Currently   Drug use: No   Sexual activity: Yes    Partners: Male    Birth control/protection: I.U.D.    Comment: Mirena inserted 11-19-15  Other Topics Concern   Not on file  Social History Narrative   Not on file   Social Determinants of Health   Financial Resource Strain: Not on file  Food Insecurity: Not on file  Transportation Needs: Not on file  Physical Activity: Not on file  Stress: Not on file   Social Connections: Not on file  Intimate Partner Violence: Not on file     PHYSICAL EXAM:  VS: Ht 5\' 8"  (1.727 m)   Wt 180 lb (81.6 kg)   BMI 27.37 kg/m  Physical Exam Gen: NAD, alert, cooperative with exam, well-appearing   ECSWT Note Caroline Roberts 21-Sep-1971  Procedure: ECSWT Indications: Right heel pain  Procedure Details Consent: Risks of procedure as well as the alternatives and risks of each were explained to the (patient/caregiver).  Consent for procedure obtained. Time Out: Verified patient identification, verified procedure, site/side was marked, verified correct patient position, special equipment/implants available, medications/allergies/relevent history reviewed, required imaging and test results available.  Performed.  The area was cleaned with iodine and alcohol swabs.    The right plantar fascia was targeted for Extracorporeal shockwave therapy.   Preset: Planter fasciitis Power Level: 80 Frequency: 10 Impulse/cycles: 3000 Head size: Large Session: First  Patient did tolerate procedure well.  ECSWT Note Caroline Roberts [birth year]  Procedure: ECSWT Indications: Left heel pain  Procedure Details Consent: Risks of procedure as well as the alternatives and risks of each were explained to the (patient/caregiver).  Consent for procedure obtained. Time Out: Verified patient identification, verified procedure, site/side was marked, verified correct patient position, special equipment/implants available, medications/allergies/relevent  history reviewed, required imaging and test results available.  Performed.  The area was cleaned with iodine and alcohol swabs.    The left plantar fascia was targeted for Extracorporeal shockwave therapy.   Preset: Planter fasciitis Power Level: 80 Frequency: 10 Impulse/cycles: 3000 Head size: Large Session: First  Patient did tolerate procedure well.   ASSESSMENT & PLAN:   Bilateral plantar fasciitis Completed  shockwave today.

## 2021-07-08 NOTE — Assessment & Plan Note (Signed)
Completed shockwave today. 

## 2021-07-15 ENCOUNTER — Ambulatory Visit: Payer: Self-pay | Admitting: Family Medicine

## 2021-07-15 ENCOUNTER — Encounter: Payer: Self-pay | Admitting: Family Medicine

## 2021-07-15 ENCOUNTER — Other Ambulatory Visit: Payer: Self-pay

## 2021-07-15 DIAGNOSIS — M722 Plantar fascial fibromatosis: Secondary | ICD-10-CM

## 2021-07-15 NOTE — Assessment & Plan Note (Signed)
Completed shockwave therapy  

## 2021-07-15 NOTE — Progress Notes (Signed)
Caroline Roberts - 50 y.o. female MRN [id number]  Date of birth: Jul 18, 1971  SUBJECTIVE:  Including CC & ROS.  No chief complaint on file.   Caroline Roberts is a 50 y.o. female that is presenting for shockwave therapy.   Review of Systems See HPI   HISTORY: Past Medical, Surgical, Social, and Family History Reviewed & Updated per EMR.   Pertinent Historical Findings include:  Past Medical History:  Diagnosis Date   Abnormal Pap smear of cervix    --in her 20's had colposcopy--CIN I--no treatment   Anxiety     Past Surgical History:  Procedure Laterality Date   INTRAUTERINE DEVICE INSERTION     mirena inserted 11-16-15    Family History  Problem Relation Age of Onset   Hypertension Mother    Diabetes Mother    Hypertension Father    Melanoma Father    Breast cancer Paternal Grandmother 49   Hypertension Paternal Grandmother    Diabetes Maternal Grandmother    Hypertension Maternal Grandmother    Stroke Maternal Grandmother    Diabetes Maternal Grandfather    Hypertension Maternal Grandfather    Stroke Maternal Grandfather    Hypertension Paternal Grandfather     Social History   Socioeconomic History   Marital status: Married    Spouse name: Not on file   Number of children: Not on file   Years of education: Not on file   Highest education level: Not on file  Occupational History   Not on file  Tobacco Use   Smoking status: Never   Smokeless tobacco: Never  Vaping Use   Vaping Use: Never used  Substance and Sexual Activity   Alcohol use: Not Currently   Drug use: No   Sexual activity: Yes    Partners: Male    Birth control/protection: I.U.D.    Comment: Mirena inserted 11-19-15  Other Topics Concern   Not on file  Social History Narrative   Not on file   Social Determinants of Health   Financial Resource Strain: Not on file  Food Insecurity: Not on file  Transportation Needs: Not on file  Physical Activity: Not on file  Stress: Not on file   Social Connections: Not on file  Intimate Partner Violence: Not on file     PHYSICAL EXAM:  VS: Ht 5\' 8"  (1.727 m)   Wt 180 lb (81.6 kg)   BMI 27.37 kg/m  Physical Exam Gen: NAD, alert, cooperative with exam, well-appearing   ECSWT Note Caroline Roberts 07-12-1971  Procedure: ECSWT Indications: right foot pain  Procedure Details Consent: Risks of procedure as well as the alternatives and risks of each were explained to the (patient/caregiver).  Consent for procedure obtained. Time Out: Verified patient identification, verified procedure, site/side was marked, verified correct patient position, special equipment/implants available, medications/allergies/relevent history reviewed, required imaging and test results available.  Performed.  The area was cleaned with iodine and alcohol swabs.    The right plantar fascia was targeted for Extracorporeal shockwave therapy.   Preset: Planter fasciitis Power Level: 100 Frequency: 10 Impulse/cycles: 3300 Head size: Large Session: Second  Patient did tolerate procedure well.  ECSWT Note Caroline Roberts [birth year]  Procedure: ECSWT Indications: left foot pain  Procedure Details Consent: Risks of procedure as well as the alternatives and risks of each were explained to the (patient/caregiver).  Consent for procedure obtained. Time Out: Verified patient identification, verified procedure, site/side was marked, verified correct patient position, special equipment/implants available, medications/allergies/relevent history  reviewed, required imaging and test results available.  Performed.  The area was cleaned with iodine and alcohol swabs.    The left plantar fascia was targeted for Extracorporeal shockwave therapy.   Preset: Planter fasciitis Power Level: 100 Frequency: 10 Impulse/cycles: 3300 Head size: Large Session: Second  Patient did tolerate procedure well.   ASSESSMENT & PLAN:   Bilateral plantar fasciitis Completed  shockwave therapy.

## 2021-08-05 ENCOUNTER — Other Ambulatory Visit: Payer: Self-pay

## 2021-08-05 ENCOUNTER — Ambulatory Visit: Payer: Self-pay | Admitting: Family Medicine

## 2021-08-05 ENCOUNTER — Encounter: Payer: Self-pay | Admitting: Family Medicine

## 2021-08-05 DIAGNOSIS — M722 Plantar fascial fibromatosis: Secondary | ICD-10-CM

## 2021-08-05 NOTE — Assessment & Plan Note (Signed)
Completed shockwave today. 

## 2021-08-05 NOTE — Progress Notes (Signed)
Caroline Roberts - 50 y.o. female MRN [id number]  Date of birth: [birth year]  SUBJECTIVE:  Including CC & ROS.  No chief complaint on file.   Caroline Roberts is a 50 y.o. female that is presenting for shockwave therapy   Review of Systems See HPI   HISTORY: Past Medical, Surgical, Social, and Family History Reviewed & Updated per EMR.   Pertinent Historical Findings include:  Past Medical History:  Diagnosis Date   Abnormal Pap smear of cervix    --in her 20's had colposcopy--CIN I--no treatment   Anxiety     Past Surgical History:  Procedure Laterality Date   INTRAUTERINE DEVICE INSERTION     mirena inserted 11-16-15    Family History  Problem Relation Age of Onset   Hypertension Mother    Diabetes Mother    Hypertension Father    Melanoma Father    Breast cancer Paternal Grandmother 63   Hypertension Paternal Grandmother    Diabetes Maternal Grandmother    Hypertension Maternal Grandmother    Stroke Maternal Grandmother    Diabetes Maternal Grandfather    Hypertension Maternal Grandfather    Stroke Maternal Grandfather    Hypertension Paternal Grandfather     Social History   Socioeconomic History   Marital status: Married    Spouse name: Not on file   Number of children: Not on file   Years of education: Not on file   Highest education level: Not on file  Occupational History   Not on file  Tobacco Use   Smoking status: Never   Smokeless tobacco: Never  Vaping Use   Vaping Use: Never used  Substance and Sexual Activity   Alcohol use: Not Currently   Drug use: No   Sexual activity: Yes    Partners: Male    Birth control/protection: I.U.D.    Comment: Mirena inserted 11-19-15  Other Topics Concern   Not on file  Social History Narrative   Not on file   Social Determinants of Health   Financial Resource Strain: Not on file  Food Insecurity: Not on file  Transportation Needs: Not on file  Physical Activity: Not on file  Stress: Not on file  Social  Connections: Not on file  Intimate Partner Violence: Not on file     PHYSICAL EXAM:  VS: Ht 5\' 8"  (1.727 m)   Wt 180 lb (81.6 kg)   BMI 27.37 kg/m  Physical Exam Gen: NAD, alert, cooperative with exam, well-appearing   ECSWT Note Caroline Roberts 1971/04/01  Procedure: ECSWT Indications: right foot pain   Procedure Details Consent: Risks of procedure as well as the alternatives and risks of each were explained to the (patient/caregiver).  Consent for procedure obtained. Time Out: Verified patient identification, verified procedure, site/side was marked, verified correct patient position, special equipment/implants available, medications/allergies/relevent history reviewed, required imaging and test results available.  Performed.  The area was cleaned with iodine and alcohol swabs.    The right plantar fascia was targeted for Extracorporeal shockwave therapy.   Preset: Planter fasciitis Power Level: 100 Frequency: 10 Impulse/cycles: 3700 Head size: Medium Session: Third  Patient did tolerate procedure well.  ECSWT Note Caroline Roberts January 03, 1971  Procedure: ECSWT Indications: left foot pain   Procedure Details Consent: Risks of procedure as well as the alternatives and risks of each were explained to the (patient/caregiver).  Consent for procedure obtained. Time Out: Verified patient identification, verified procedure, site/side was marked, verified correct patient position, special equipment/implants available,  medications/allergies/relevent history reviewed, required imaging and test results available.  Performed.  The area was cleaned with iodine and alcohol swabs.    The leftt plantar fascia was targeted for Extracorporeal shockwave therapy.   Preset: Planter fasciitis Power Level: 100 Frequency: 10 Impulse/cycles: 3700 Head size: Medium Session: Third  Patient did tolerate procedure well.   ASSESSMENT & PLAN:   Bilateral plantar fasciitis Completed shockwave  today

## 2021-08-12 ENCOUNTER — Ambulatory Visit: Payer: Self-pay | Admitting: Family Medicine

## 2021-08-12 ENCOUNTER — Encounter: Payer: Self-pay | Admitting: Family Medicine

## 2021-08-12 ENCOUNTER — Other Ambulatory Visit: Payer: Self-pay

## 2021-08-12 DIAGNOSIS — M722 Plantar fascial fibromatosis: Secondary | ICD-10-CM

## 2021-08-12 NOTE — Progress Notes (Signed)
Caroline Roberts - 50 y.o. female MRN [id number]  Date of birth: Apr 02, 1971  SUBJECTIVE:  Including CC & ROS.  No chief complaint on file.   Caroline Roberts is a 50 y.o. female that is  presenting for shockwave therapy .    Review of Systems See HPI   HISTORY: Past Medical, Surgical, Social, and Family History Reviewed & Updated per EMR.   Pertinent Historical Findings include:  Past Medical History:  Diagnosis Date   Abnormal Pap smear of cervix    --in her 20's had colposcopy--CIN I--no treatment   Anxiety     Past Surgical History:  Procedure Laterality Date   INTRAUTERINE DEVICE INSERTION     mirena inserted 11-16-15    Family History  Problem Relation Age of Onset   Hypertension Mother    Diabetes Mother    Hypertension Father    Melanoma Father    Breast cancer Paternal Grandmother 38   Hypertension Paternal Grandmother    Diabetes Maternal Grandmother    Hypertension Maternal Grandmother    Stroke Maternal Grandmother    Diabetes Maternal Grandfather    Hypertension Maternal Grandfather    Stroke Maternal Grandfather    Hypertension Paternal Grandfather     Social History   Socioeconomic History   Marital status: Married    Spouse name: Not on file   Number of children: Not on file   Years of education: Not on file   Highest education level: Not on file  Occupational History   Not on file  Tobacco Use   Smoking status: Never   Smokeless tobacco: Never  Vaping Use   Vaping Use: Never used  Substance and Sexual Activity   Alcohol use: Not Currently   Drug use: No   Sexual activity: Yes    Partners: Male    Birth control/protection: I.U.D.    Comment: Mirena inserted 11-19-15  Other Topics Concern   Not on file  Social History Narrative   Not on file   Social Determinants of Health   Financial Resource Strain: Not on file  Food Insecurity: Not on file  Transportation Needs: Not on file  Physical Activity: Not on file  Stress: Not on file   Social Connections: Not on file  Intimate Partner Violence: Not on file     PHYSICAL EXAM:  VS: Ht 5\' 8"  (1.727 m)   Wt 180 lb (81.6 kg)   BMI 27.37 kg/m  Physical Exam Gen: NAD, alert, cooperative with exam, well-appearing   ECSWT Note Caroline Roberts Sep 26, 1971  Procedure: ECSWT Indications: Right foot pain  Procedure Details Consent: Risks of procedure as well as the alternatives and risks of each were explained to the (patient/caregiver).  Consent for procedure obtained. Time Out: Verified patient identification, verified procedure, site/side was marked, verified correct patient position, special equipment/implants available, medications/allergies/relevent history reviewed, required imaging and test results available.  Performed.  The area was cleaned with iodine and alcohol swabs.    The right plantar fashion was targeted for Extracorporeal shockwave therapy.   Preset: Planter fasciitis Power Level: 110 Frequency: 10 Impulse/cycles: 4100 Head size: Medium Session: Fourth  Patient did tolerate procedure well.  ECSWT Note Caroline Roberts 1971-02-26  Procedure: ECSWT Indications: leftt foot pain  Procedure Details Consent: Risks of procedure as well as the alternatives and risks of each were explained to the (patient/caregiver).  Consent for procedure obtained. Time Out: Verified patient identification, verified procedure, site/side was marked, verified correct patient position, special equipment/implants  available, medications/allergies/relevent history reviewed, required imaging and test results available.  Performed.  The area was cleaned with iodine and alcohol swabs.    The leftt plantar fashion was targeted for Extracorporeal shockwave therapy.   Preset: Planter fasciitis Power Level: 110 Frequency: 10 Impulse/cycles: 4100 Head size: Medium Session: Fourth  Patient did tolerate procedure well.   ASSESSMENT & PLAN:   Bilateral plantar  fasciitis Completed shockwave therapy today.

## 2021-08-12 NOTE — Assessment & Plan Note (Signed)
Completed shockwave therapy today.  

## 2021-08-19 ENCOUNTER — Encounter: Payer: Self-pay | Admitting: Family Medicine

## 2021-08-19 ENCOUNTER — Ambulatory Visit: Payer: Self-pay | Admitting: Family Medicine

## 2021-08-19 ENCOUNTER — Other Ambulatory Visit: Payer: Self-pay

## 2021-08-19 DIAGNOSIS — M722 Plantar fascial fibromatosis: Secondary | ICD-10-CM

## 2021-08-19 NOTE — Progress Notes (Signed)
  Caroline Roberts - 50 y.o. female MRN [id number]  Date of birth: March 18, 1971  SUBJECTIVE:  Including CC & ROS.  No chief complaint on file.   Caroline Roberts is a 50 y.o. female that is here for shockwave therapy.    Review of Systems See HPI   HISTORY: Past Medical, Surgical, Social, and Family History Reviewed & Updated per EMR.   Pertinent Historical Findings include:  Past Medical History:  Diagnosis Date   Abnormal Pap smear of cervix    --in her 20's had colposcopy--CIN I--no treatment   Anxiety     Past Surgical History:  Procedure Laterality Date   INTRAUTERINE DEVICE INSERTION     mirena inserted 11-16-15    Family History  Problem Relation Age of Onset   Hypertension Mother    Diabetes Mother    Hypertension Father    Melanoma Father    Breast cancer Paternal Grandmother 41   Hypertension Paternal Grandmother    Diabetes Maternal Grandmother    Hypertension Maternal Grandmother    Stroke Maternal Grandmother    Diabetes Maternal Grandfather    Hypertension Maternal Grandfather    Stroke Maternal Grandfather    Hypertension Paternal Grandfather     Social History   Socioeconomic History   Marital status: Married    Spouse name: Not on file   Number of children: Not on file   Years of education: Not on file   Highest education level: Not on file  Occupational History   Not on file  Tobacco Use   Smoking status: Never   Smokeless tobacco: Never  Vaping Use   Vaping Use: Never used  Substance and Sexual Activity   Alcohol use: Not Currently   Drug use: No   Sexual activity: Yes    Partners: Male    Birth control/protection: I.U.D.    Comment: Mirena inserted 11-19-15  Other Topics Concern   Not on file  Social History Narrative   Not on file   Social Determinants of Health   Financial Resource Strain: Not on file  Food Insecurity: Not on file  Transportation Needs: Not on file  Physical Activity: Not on file  Stress: Not on file  Social  Connections: Not on file  Intimate Partner Violence: Not on file     PHYSICAL EXAM:  VS: Ht 5\' 8"  (1.727 m)   Wt 180 lb (81.6 kg)   BMI 27.37 kg/m  Physical Exam Gen: NAD, alert, cooperative with exam, well-appearing   ECSWT Note Caroline Roberts 1971/04/08  Procedure: ECSWT Indications: left foot pain   Procedure Details Consent: Risks of procedure as well as the alternatives and risks of each were explained to the (patient/caregiver).  Consent for procedure obtained. Time Out: Verified patient identification, verified procedure, site/side was marked, verified correct patient position, special equipment/implants available, medications/allergies/relevent history reviewed, required imaging and test results available.  Performed.  The area was cleaned with iodine and alcohol swabs.    The left plantar fascia was targeted for Extracorporeal shockwave therapy.   Preset: Planter fasciitis Power Level: 130 Frequency: 10 Impulse/cycles: 4500 Head size: Medium Session: Fifth  Patient did tolerate procedure well.     ASSESSMENT & PLAN:   Bilateral plantar fasciitis Completed shockwave therapy

## 2021-08-19 NOTE — Assessment & Plan Note (Signed)
Completed shockwave therapy  

## 2021-08-31 ENCOUNTER — Other Ambulatory Visit (HOSPITAL_BASED_OUTPATIENT_CLINIC_OR_DEPARTMENT_OTHER): Payer: Self-pay

## 2021-08-31 MED FILL — Estradiol TD Patch Twice Weekly 0.05 MG/24HR: TRANSDERMAL | 28 days supply | Qty: 8 | Fill #0 | Status: AC

## 2021-11-01 ENCOUNTER — Other Ambulatory Visit (HOSPITAL_BASED_OUTPATIENT_CLINIC_OR_DEPARTMENT_OTHER): Payer: Self-pay

## 2021-11-01 MED FILL — Estradiol TD Patch Twice Weekly 0.05 MG/24HR: TRANSDERMAL | 28 days supply | Qty: 8 | Fill #1 | Status: AC

## 2021-12-01 ENCOUNTER — Other Ambulatory Visit (HOSPITAL_BASED_OUTPATIENT_CLINIC_OR_DEPARTMENT_OTHER): Payer: Self-pay

## 2021-12-01 MED FILL — Estradiol TD Patch Twice Weekly 0.05 MG/24HR: TRANSDERMAL | 28 days supply | Qty: 8 | Fill #2 | Status: AC

## 2021-12-26 ENCOUNTER — Encounter: Payer: Self-pay | Admitting: Family Medicine

## 2021-12-26 ENCOUNTER — Ambulatory Visit: Payer: Self-pay

## 2021-12-26 ENCOUNTER — Ambulatory Visit (INDEPENDENT_AMBULATORY_CARE_PROVIDER_SITE_OTHER): Payer: Commercial Managed Care - PPO | Admitting: Family Medicine

## 2021-12-26 VITALS — BP 120/80 | Ht 68.0 in | Wt 180.0 lb

## 2021-12-26 DIAGNOSIS — M7502 Adhesive capsulitis of left shoulder: Secondary | ICD-10-CM | POA: Diagnosis not present

## 2021-12-26 NOTE — Patient Instructions (Signed)
Good to see you Please try heat before exercise and ice after  Please try the exercises   Please send me a message in MyChart with any questions or updates.  Please see me back in 3-4 weeks.   --Dr. Jordan Likes

## 2021-12-26 NOTE — Progress Notes (Signed)
°  Caroline Roberts - 51 y.o. female MRN [id number]  Date of birth: 1971/02/16  SUBJECTIVE:  Including CC & ROS.  No chief complaint on file.   Caroline Roberts is a 51 y.o. female that is presenting with acute left shoulder pain.  The pain is been ongoing for about 6 weeks.  Denies any history of similar pain.  Pain is worse with certain range of motion.  No improvement with modalities today.   Review of Systems See HPI   HISTORY: Past Medical, Surgical, Social, and Family History Reviewed & Updated per EMR.   Pertinent Historical Findings include:  Past Medical History:  Diagnosis Date   Abnormal Pap smear of cervix    --in her 20's had colposcopy--CIN I--no treatment   Anxiety     Past Surgical History:  Procedure Laterality Date   INTRAUTERINE DEVICE INSERTION     mirena inserted 11-16-15     PHYSICAL EXAM:  VS: BP 120/80 (BP Location: Right Arm, Patient Position: Sitting)    Ht 5\' 8"  (1.727 m)    Wt 180 lb (81.6 kg)    BMI 27.37 kg/m  Physical Exam Gen: NAD, alert, cooperative with exam, well-appearing MSK: Neurovascularly intact    Limited ultrasound: Left shoulder:  Normal-appearing biceps tendon. No changes of the subscapularis. Normal-appearing supraspinatus and static and dynamic testing. No change in the posterior glenohumeral joint  Summary: No structural changes  Ultrasound and interpretation by , MD    ASSESSMENT & PLAN:   Adhesive capsulitis of left shoulder Symptoms most consistent with frozen shoulder.  Exam is reassuring. -Counseled on home exercise therapy and supportive care. -Could consider physical therapy or injection.

## 2021-12-26 NOTE — Assessment & Plan Note (Signed)
Symptoms most consistent with frozen shoulder.  Exam is reassuring. -Counseled on home exercise therapy and supportive care. -Could consider physical therapy or injection.

## 2022-01-10 ENCOUNTER — Other Ambulatory Visit (HOSPITAL_BASED_OUTPATIENT_CLINIC_OR_DEPARTMENT_OTHER): Payer: Self-pay

## 2022-01-10 MED FILL — Estradiol TD Patch Twice Weekly 0.05 MG/24HR: TRANSDERMAL | 28 days supply | Qty: 8 | Fill #3 | Status: AC

## 2022-02-02 ENCOUNTER — Other Ambulatory Visit (HOSPITAL_BASED_OUTPATIENT_CLINIC_OR_DEPARTMENT_OTHER): Payer: Self-pay

## 2022-02-06 ENCOUNTER — Other Ambulatory Visit (HOSPITAL_BASED_OUTPATIENT_CLINIC_OR_DEPARTMENT_OTHER): Payer: Self-pay

## 2022-02-06 MED FILL — Estradiol TD Patch Twice Weekly 0.05 MG/24HR: TRANSDERMAL | 28 days supply | Qty: 8 | Fill #4 | Status: AC

## 2022-03-03 ENCOUNTER — Other Ambulatory Visit (HOSPITAL_BASED_OUTPATIENT_CLINIC_OR_DEPARTMENT_OTHER): Payer: Self-pay

## 2022-03-03 MED FILL — Estradiol TD Patch Twice Weekly 0.05 MG/24HR: TRANSDERMAL | 28 days supply | Qty: 8 | Fill #5 | Status: AC

## 2022-05-19 ENCOUNTER — Ambulatory Visit: Payer: Self-pay

## 2022-05-19 ENCOUNTER — Encounter: Payer: Self-pay | Admitting: Family Medicine

## 2022-05-19 ENCOUNTER — Ambulatory Visit (INDEPENDENT_AMBULATORY_CARE_PROVIDER_SITE_OTHER): Payer: Commercial Managed Care - PPO | Admitting: Family Medicine

## 2022-05-19 VITALS — BP 98/60 | Ht 68.0 in | Wt 180.0 lb

## 2022-05-19 DIAGNOSIS — M7502 Adhesive capsulitis of left shoulder: Secondary | ICD-10-CM | POA: Diagnosis not present

## 2022-05-19 MED ORDER — TRIAMCINOLONE ACETONIDE 40 MG/ML IJ SUSP
40.0000 mg | Freq: Once | INTRAMUSCULAR | Status: AC
  Administered 2022-05-19: 40 mg via INTRA_ARTICULAR

## 2022-05-19 NOTE — Assessment & Plan Note (Signed)
Acutely worsening.  Still having limited external rotation. -Counseled on home exercise therapy and supportive care. -Injection today. -Could consider hydrodistention or physical therapy.

## 2022-05-19 NOTE — Patient Instructions (Signed)
Good to see you Please use heat before exercise and ice after  Please continue the exercises   Please send me a message in MyChart with any questions or updates.  Please see me back in 4-6 weeks.   --Dr. Jordan Likes

## 2022-05-19 NOTE — Progress Notes (Signed)
  Caroline Roberts - 51 y.o. female MRN [id number]  Date of birth: 19-May-1971  SUBJECTIVE:  Including CC & ROS.  No chief complaint on file.   Caroline Roberts is a 51 y.o. female that is following up for left shoulder tightness.  She has tried home exercise therapy with limited improvement she is having limited range of motion and is always worse in the morning.  She has tried medications.   Review of Systems See HPI   HISTORY: Past Medical, Surgical, Social, and Family History Reviewed & Updated per EMR.   Pertinent Historical Findings include:  Past Medical History:  Diagnosis Date   Abnormal Pap smear of cervix    --in her 20's had colposcopy--CIN I--no treatment   Anxiety     Past Surgical History:  Procedure Laterality Date   INTRAUTERINE DEVICE INSERTION     mirena inserted 11-16-15     PHYSICAL EXAM:  VS: BP 98/60 (BP Location: Left Arm, Patient Position: Sitting)   Ht 5\' 8"  (1.727 m)   Wt 180 lb (81.6 kg)   BMI 27.37 kg/m  Physical Exam Gen: NAD, alert, cooperative with exam, well-appearing MSK:  Neurovascularly intact     Aspiration/Injection Procedure Note Caroline Roberts 23-Nov-1971  Procedure: injection Indications: Left shoulder pain  Procedure Details Consent: Risks of procedure as well as the alternatives and risks of each were explained to the (patient/caregiver).  Consent for procedure obtained. Time Out: Verified patient identification, verified procedure, site/side was marked, verified correct patient position, special equipment/implants available, medications/allergies/relevent history reviewed, required imaging and test results available.  Performed.  The area was cleaned with iodine and alcohol swabs.    The left glenohumeral joint was injected using 3 cc of 1% lidocaine 2 gauge 3-1/2 inch needle.  The syringe was switched to mixture containing 1 cc's of 40 mg Kenalog and 4 cc's of 0.5% bupivacaine was injected.  Ultrasound was used. Images were obtained  in short views showing the injection.     A sterile dressing was applied.  Patient did tolerate procedure well.     ASSESSMENT & PLAN:   Adhesive capsulitis of left shoulder Acutely worsening.  Still having limited external rotation. -Counseled on home exercise therapy and supportive care. -Injection today. -Could consider hydrodistention or physical therapy.

## 2022-06-23 ENCOUNTER — Ambulatory Visit: Payer: Commercial Managed Care - PPO | Admitting: Family Medicine

## 2023-03-16 ENCOUNTER — Ambulatory Visit: Payer: Commercial Managed Care - PPO | Admitting: Family Medicine

## 2023-03-23 ENCOUNTER — Encounter: Payer: Self-pay | Admitting: Family Medicine

## 2023-03-23 ENCOUNTER — Other Ambulatory Visit: Payer: Self-pay

## 2023-03-23 ENCOUNTER — Ambulatory Visit (INDEPENDENT_AMBULATORY_CARE_PROVIDER_SITE_OTHER): Payer: Commercial Managed Care - PPO | Admitting: Family Medicine

## 2023-03-23 VITALS — BP 110/70 | Ht 68.0 in | Wt 185.0 lb

## 2023-03-23 DIAGNOSIS — M7581 Other shoulder lesions, right shoulder: Secondary | ICD-10-CM | POA: Insufficient documentation

## 2023-03-23 DIAGNOSIS — M25511 Pain in right shoulder: Secondary | ICD-10-CM

## 2023-03-23 DIAGNOSIS — S39012A Strain of muscle, fascia and tendon of lower back, initial encounter: Secondary | ICD-10-CM | POA: Insufficient documentation

## 2023-03-23 DIAGNOSIS — M778 Other enthesopathies, not elsewhere classified: Secondary | ICD-10-CM | POA: Insufficient documentation

## 2023-03-23 DIAGNOSIS — M7502 Adhesive capsulitis of left shoulder: Secondary | ICD-10-CM | POA: Diagnosis not present

## 2023-03-23 MED ORDER — METHOCARBAMOL 500 MG PO TABS: 500.0000 mg | ORAL_TABLET | Freq: Three times a day (TID) | ORAL | 1 refills | Status: AC | PRN

## 2023-03-23 MED ORDER — TRIAMCINOLONE ACETONIDE 40 MG/ML IJ SUSP
40.0000 mg | Freq: Once | INTRAMUSCULAR | Status: AC
  Administered 2023-03-23: 40 mg via INTRA_ARTICULAR

## 2023-03-23 NOTE — Progress Notes (Signed)
  Caroline Roberts - 52 y.o. female MRN [id number]  Date of birth: 1970/12/13  SUBJECTIVE:  Including CC & ROS.  No chief complaint on file.   Caroline Roberts is a 52 y.o. female that is presenting with acute on chronic left shoulder pain, right shoulder pain and acute low back pain.  The low back pain is worse in the morning.  She also notices the pain at night before going to bed.  She does not notice it during the course of the day.  She has no pain with the left shoulder but does have limited external rotation.  She is having severe right-sided limited rotation and pain in the right shoulder with no inciting event.   Review of Systems See HPI   HISTORY: Past Medical, Surgical, Social, and Family History Reviewed & Updated per EMR.   Pertinent Historical Findings include:  Past Medical History:  Diagnosis Date   Abnormal Pap smear of cervix    --in her 20's had colposcopy--CIN I--no treatment   Anxiety     Past Surgical History:  Procedure Laterality Date   INTRAUTERINE DEVICE INSERTION     mirena inserted 11-16-15     PHYSICAL EXAM:  VS: BP 110/70 (BP Location: Left Arm, Patient Position: Sitting)   Ht 5\' 8"  (1.727 m)   Wt 185 lb (83.9 kg)   BMI 28.13 kg/m  Physical Exam Gen: NAD, alert, cooperative with exam, well-appearing MSK:  Neurovascularly intact    Limited ultrasound: Right shoulder pain:  No effusion the suprapatellar pouch. Normal-appearing subscapularis and supraspinatus. Mild degenerative changes appreciated in the posterior labrum.  Summary: No significant structural findings.  Ultrasound and interpretation by Clare Gandy, MD  Aspiration/Injection Procedure Note Caroline Roberts 04/01/1971  Procedure: Injection Indications: Right shoulder pain  Procedure Details Consent: Risks of procedure as well as the alternatives and risks of each were explained to the (patient/caregiver).  Consent for procedure obtained. Time Out: Verified patient  identification, verified procedure, site/side was marked, verified correct patient position, special equipment/implants available, medications/allergies/relevent history reviewed, required imaging and test results available.  Performed.  The area was cleaned with iodine and alcohol swabs.    The right glenohumeral joint was injected using 4 cc of 1% lidocaine and 0.4 cc of 8.4% sodium bicarbonate on a 22-gauge 1-1/2 inch needle.  The syringe was switched to mixture containing 1 cc's of 40 mg Kenalog, 3 cc of 1% lidocaine and 3 cc's of 0.25% bupivacaine was injected.  Ultrasound was used. Images were obtained in short views showing the injection.     A sterile dressing was applied.  Patient did tolerate procedure well.     ASSESSMENT & PLAN:   Adhesive capsulitis of left shoulder Acute on chronic in nature.  Having limited external rotation.  She is able to get near full range of motion. -Counseled on home exercise therapy and supportive care. -Referral to physical therapy  Strain of lumbar region Acute on chronic in nature.  Does have degenerative changes and mild scoliosis on previous CT scan -Counseled on home exercise therapy and supportive care. -Referral to physical therapy. -Could consider further imaging  Capsulitis of right shoulder Acutely occurring.  Limited range of motion and pain with external rotation. -Counseled on home exercise therapy and supportive care.- - Referral to physical therapy. -Injection today. -Could consider further imaging

## 2023-03-23 NOTE — Assessment & Plan Note (Signed)
Acutely occurring.  Limited range of motion and pain with external rotation. -Counseled on home exercise therapy and supportive care.- - Referral to physical therapy. -Injection today. -Could consider further imaging

## 2023-03-23 NOTE — Assessment & Plan Note (Signed)
Acute on chronic in nature.  Does have degenerative changes and mild scoliosis on previous CT scan -Counseled on home exercise therapy and supportive care. -Referral to physical therapy. -Could consider further imaging

## 2023-03-23 NOTE — Patient Instructions (Signed)
Good to see you Please use heat before exercise and ice after  Please continue the exercises  We have made a referral to physical therapy   Please send me a message in MyChart with any questions or updates.  Please see me back in 4-6 weeks.   --Dr. Jordan Likes

## 2023-03-23 NOTE — Assessment & Plan Note (Signed)
Acute on chronic in nature.  Having limited external rotation.  She is able to get near full range of motion. -Counseled on home exercise therapy and supportive care. -Referral to physical therapy

## 2023-03-26 ENCOUNTER — Encounter: Payer: Self-pay | Admitting: *Deleted

## 2023-11-07 ENCOUNTER — Encounter: Payer: Self-pay | Admitting: Family Medicine

## 2023-11-07 ENCOUNTER — Ambulatory Visit (INDEPENDENT_AMBULATORY_CARE_PROVIDER_SITE_OTHER): Payer: Commercial Managed Care - PPO | Admitting: Family Medicine

## 2023-11-07 VITALS — BP 118/80 | Ht 68.0 in | Wt 185.0 lb

## 2023-11-07 DIAGNOSIS — G8929 Other chronic pain: Secondary | ICD-10-CM

## 2023-11-07 DIAGNOSIS — M545 Low back pain, unspecified: Secondary | ICD-10-CM | POA: Diagnosis not present

## 2023-11-07 NOTE — Progress Notes (Signed)
CHIEF COMPLAINT: No chief complaint on file.  _____________________________________________________________ SUBJECTIVE  HPI  Pt is a 52 y.o. female here for evaluation of lower back pain  Seen 03/23/23 for lumbar strain, referred to physical therapy Identifies as a very active person. Goal is to be able to run routinely Daily lower back pain Difficulty sleeping Went to physical therapy; 4 sessions, high copay was unable to go to many more She feels HEP has been helpful, but then she will start to run, or will do outdoor work or sneeze and will have pain come back Therapies have included: ibuprofen, robaxin, heating pad, glucosamine, ginger, tiger balm Restarted PT 3 weeks ago, and then got hurt, then did it for 3 days week before last and hasn't done anything since Sunday  Logs 12,000 steps a day, has not run as much, last run was months ago Only wears sneakers/brooks, rotates them. Current shoes bought 1 month ago, has used inserts  No recent films available. CT abd/pelvis 2018 demonstrated bulging disc L3-L4, L4-L5, degenerative changes, mild scoliosis present  Has been previously evaluated for R/L shoulder pain, plantar fasciitis   ------------------------------------------------------------------------------------------------------ Past Medical History:  Diagnosis Date   Abnormal Pap smear of cervix    --in her 20's had colposcopy--CIN I--no treatment   Anxiety     Past Surgical History:  Procedure Laterality Date   INTRAUTERINE DEVICE INSERTION     mirena inserted 11-16-15     No outpatient medications have been marked as taking for the 11/07/23 encounter (Appointment) with Burna Forts, MD.    ------------------------------------------------------------------------------------------------------  _____________________________________________________________ OBJECTIVE  PHYSICAL EXAM  Today's Vitals   11/07/23 0805  BP: 118/80  Weight: 185 lb (83.9 kg)  Height:  5\' 8"  (1.727 m)   Body mass index is 28.13 kg/m.   reviewed  General: A+Ox3, no acute distress, well-nourished, appropriate affect CV: pulses 2+ regular, nondiaphoretic, no peripheral edema, cap refill <2sec Lungs: no audible wheezing, non-labored breathing, bilateral chest rise/fall, nontachypneic Skin: warm, well-perfused, non-icteric, no susp lesions or rashes Neuro: no focal deficits. Sensation intact, muscle tone wnl, no atrophy Psych: no signs of depression or anxiety MSK:  Back Exam: Mildly increased lumbar lordosis. Lumbar flexion 110, extension 30, oblique rotation elicits R lumbar/gluteal tightness TTP L5/S1 region, primarily paraspinal. Some discomfort over SIJ b/l, not the same pain No muscle wasting Stork negative Notable SIJ/ASIS misalignment, right anterior Notable R trendelenberg SLR neg Minimal leg/length discrepancy Log roll/scour negative FABER/FADIR negative Decreased ROM R hamstring. Full hip flexion. IR 15, 40 ER b/l Decreased ROM quad b/l Ober test + R>L  _____________________________________________________________ ASSESSMENT/PLAN Diagnoses and all orders for this visit:  Chronic bilateral low back pain without sciatica -     Cancel: DG Lumbar Spine Complete; Future -     DG Lumbar Spine Complete; Future -     AMB referral to sports medicine  Primarily paraspinal lumbosacral pain, suspected recurrent strain exacerbated by biomechanical discrepancies as found above. Discussed options for management. - last imaging of spine located 2018, repeat lumbar films today - OMT referral placed; will work with Tresa Endo to identify optimal facility that works with her insurance - formal PT advised. Caroline Roberts shares there is a person that she plans to work with, who does not take insurance/referrals - anticipate follow-up 6-8 weeks, sooner as needed  All questions answered. Return precautions discussed. Patient verbalized understanding and is in agreement with  plan  Electronically signed by: Burna Forts, MD 11/07/2023 7:19 AM

## 2023-11-15 NOTE — Progress Notes (Signed)
Caroline Roberts D.[facility] [address] [place] [address] Phone: [phone]   Assessment and Plan:    1. Chronic bilateral low back pain without sciatica 2. Somatic dysfunction of lumbar region 3. Somatic dysfunction of pelvic region 4. Somatic dysfunction of sacral region  -Chronic with exacerbation, initial sports medicine visit - Acute flare of chronic low back pain that has been ongoing and intermittent over the past 1 year.  I believe pain is multifactorial with patient generally deconditioning from stopping many physical activities including running due to bouts of plantar fasciitis and frozen shoulder.  I believe this led to muscular weakening in the low back and core, weight gain.  Patient additionally has mild degenerative changes with DDD at L5-S1, however this looks unchanged compared with CT abdomen pelvis from 2018 - Start HEP for low back and core - Start meloxicam 15 mg daily x2 weeks.  If still having pain after 2 weeks, complete 3rd-week of NSAID. May use remaining NSAID as needed once daily for pain control.  Do not to use additional over-the-counter NSAIDs (ibuprofen, naproxen, Advil, Aleve) while taking prescription NSAIDs.  May use Tylenol [phone] mg 2 to 3 times a day for breakthrough pain. -Recommend gradual return to physical activity.  Start activity at 50% (speed, duration, reps, sets, intensity) and allow 24 hours to assess for worsening pain.  If 50% is well-tolerated, may increase next activity to 75%.  If 75% is well-tolerated, may increase next physical activity to 100%.  If any of these levels cause pain, recommend dropping down to previous level for an additional 2-3 attempts before advancing. - Patient elected for initial OMT today.  Tolerated well per note below. - Decision today to treat with OMT was based on Physical Exam  After verbal consent patient was treated with HVLA (high velocity low amplitude), ME (muscle  energy), FPR (flex positional release), ST (soft tissue), PC/PD (Pelvic Compression/ Pelvic Decompression) techniques in sacrum, lumbar, and pelvic areas. Patient tolerated the procedure well with improvement in symptoms.  Patient educated on potential side effects of soreness and recommended to rest, hydrate, and use Tylenol as needed for pain control.  15 additional minutes spent for educating Therapeutic Home Exercise Program.  This included exercises focusing on stretching, strengthening, with focus on eccentric aspects.   Long term goals include an improvement in range of motion, strength, endurance as well as avoiding reinjury. Patient's frequency would include in 1-2 times a day, 3-5 times a week for a duration of 6-12 weeks. Proper technique shown and discussed handout in great detail with ATC.  All questions were discussed and answered.    Pertinent previous records reviewed include none  Follow Up: 4 weeks for reevaluation.  Could consider repeat OMT   Subjective:   I, Caroline Roberts, am serving as a Neurosurgeon for Caroline Roberts  Chief Complaint: right pelvis pain   HPI:   11/16/2023 Patient is a 52 year old female with right pelvis pain. Patient states that she has had back pain for several months. Right sided low back pain. Decreased ROM. Pain when getting dressed has to sit to get dressed. She isn't able to run and all she wants to do is run. No meds. No numbness or tingling, no radiating pain. Is interested in OMT   Relevant Historical Information: None pertinent  Additional pertinent review of systems negative.   Current Outpatient Medications:    acyclovir (ZOVIRAX) 200 MG capsule, Take 4 capsules twice  a day for up to 5 days., Disp: 30 capsule, Rfl: 4   levonorgestrel (MIRENA) 20 MCG/24HR IUD, 1 each by Intrauterine route once., Disp: , Rfl:    methocarbamol (ROBAXIN) 500 MG tablet, Take 1 tablet (500 mg total) by mouth 3 (three) times daily as needed for muscle  spasms., Disp: 90 tablet, Rfl: 1   estradiol (VIVELLE-DOT) 0.05 MG/24HR patch, PLACE 1 PATCH (0.05 MG TOTAL) ONTO THE SKIN 2 (TWO) TIMES A WEEK., Disp: 8 patch, Rfl: 12   Objective:     Vitals:   11/16/23 1313  BP: 124/82  Pulse: 62  SpO2: 99%  Weight: 185 lb (83.9 kg)  Height: 5\' 8"  (1.727 m)      Body mass index is 28.13 kg/m.    Physical Exam:    Gen: Appears well, nad, nontoxic and pleasant Psych: Alert and oriented, appropriate mood and affect Neuro: sensation intact, strength is 5/5 in upper and lower extremities, muscle tone wnl Skin: no susupicious lesions or rashes  Back - Normal skin, Spine with normal alignment and no deformity.   No tenderness to vertebral process palpation.   Bilateral lumbar paraspinous muscles are mildly tender and without spasm NTTP gluteal musculature TTP right sacral base Straight leg raise negative Trendelenberg negative Piriformis Test negative Gait normal   General: Well-appearing, cooperative, sitting comfortably in no acute distress.   OMT Physical Exam:  ASIS Compression Test: Positive Right Sacrum: Positive sphinx, TTP right sacral base Lumbar: TTP paraspinal, L1-3 RRSL Pelvis: Right anterior innominate   Electronically signed by:  Caroline Roberts D.[facility] 2:27 PM 11/16/23

## 2023-11-16 ENCOUNTER — Ambulatory Visit (INDEPENDENT_AMBULATORY_CARE_PROVIDER_SITE_OTHER): Payer: Commercial Managed Care - PPO | Admitting: Sports Medicine

## 2023-11-16 ENCOUNTER — Ambulatory Visit (INDEPENDENT_AMBULATORY_CARE_PROVIDER_SITE_OTHER): Payer: Commercial Managed Care - PPO

## 2023-11-16 VITALS — BP 124/82 | HR 62 | Ht 68.0 in | Wt 185.0 lb

## 2023-11-16 DIAGNOSIS — M9905 Segmental and somatic dysfunction of pelvic region: Secondary | ICD-10-CM

## 2023-11-16 DIAGNOSIS — M545 Low back pain, unspecified: Secondary | ICD-10-CM | POA: Diagnosis not present

## 2023-11-16 DIAGNOSIS — G8929 Other chronic pain: Secondary | ICD-10-CM

## 2023-11-16 DIAGNOSIS — M9903 Segmental and somatic dysfunction of lumbar region: Secondary | ICD-10-CM

## 2023-11-16 DIAGNOSIS — M9904 Segmental and somatic dysfunction of sacral region: Secondary | ICD-10-CM

## 2023-11-16 NOTE — Patient Instructions (Signed)
-   Start meloxicam 15 mg daily x2 weeks.  If still having pain after 2 weeks, complete 3rd-week of NSAID. May use remaining NSAID as needed once daily for pain control.  Do not to use additional over-the-counter NSAIDs (ibuprofen, naproxen, Advil, Aleve) while taking prescription NSAIDs.  May use Tylenol [phone] mg 2 to 3 times a day for breakthrough pain.  -Recommend gradual return to physical activity.  Start activity at 50% (speed, duration, reps, sets, intensity) and allow 24 hours to assess for worsening pain.  If 50% is well-tolerated, may increase next activity to 75%.  If 75% is well-tolerated, may increase next physical activity to 100%.  If any of these levels cause pain, recommend dropping down to previous level for an additional 2-3 attempts before advancing.  Low back HEP 4 week follow up

## 2023-11-17 ENCOUNTER — Encounter: Payer: Self-pay | Admitting: Sports Medicine

## 2023-11-19 ENCOUNTER — Other Ambulatory Visit: Payer: Self-pay | Admitting: Sports Medicine

## 2023-11-19 MED ORDER — MELOXICAM 15 MG PO TABS: 15.0000 mg | ORAL_TABLET | Freq: Every day | ORAL | 0 refills | Status: AC

## 2023-11-19 NOTE — Telephone Encounter (Signed)
Meloxicam placed and patient notified

## 2023-12-16 ENCOUNTER — Other Ambulatory Visit: Payer: Self-pay | Admitting: Sports Medicine

## 2023-12-20 NOTE — Progress Notes (Signed)
 [facility] [address] [place] [address] Phone: [phone]   Assessment and Plan:     1. Chronic bilateral low back pain without sciatica -Chronic with exacerbation, subsequent visit - Overall improvement in acute on chronic flare of low back pain after intermittently using meloxicam  15 mg, starting HEP, OMT.  Still most consistent with general deconditioning and somatic dysfunction - May continue meloxicam  15 mg daily as needed.  Recommend limiting chronic NSAIDs to 1-2 doses per week - Continue HEP for low back and core - OMT performed per note below  2. Chronic right shoulder pain 3. Capsulitis of right shoulder -Chronic with exacerbation, initial sports medicine visit - History of chronic right shoulder pain history of capsulitis with mild, though incomplete improvement with CSI - Start HEP for right shoulder - OMT performed per note below - Continue meloxicam  15 mg daily as needed.  4. Somatic dysfunction of thoracic region 5. Somatic dysfunction of lumbar region 6. Somatic dysfunction of pelvic region 7. Somatic dysfunction of sacral region 8. Somatic dysfunction of upper extremities  -Chronic with exacerbation, subsequent visit - Patient has received relief with OMT in the past.  Elects for repeat OMT today.  Tolerated well per note below. - Decision today to treat with OMT was based on Physical Exam  After verbal consent patient was treated with HVLA (high velocity low amplitude), ME (muscle energy), FPR (flex positional release), ST (soft tissue), PC/PD (Pelvic Compression/ Pelvic Decompression) techniques in upper extremity, sacrum, thoracic, lumbar, and pelvic areas. Patient tolerated the procedure well with improvement in symptoms.  Patient educated on potential side effects of soreness and recommended to rest, hydrate, and use Tylenol as needed for pain control.   Pertinent previous records reviewed include  none  Follow Up: 4 weeks for reevaluation.  Could consider repeat OMT versus further evaluation of chronic right shoulder pain with x-ray and possible CSI   Subjective:   I, [name], am serving as a neurosurgeon for Doctor Morene Mace   Chief Complaint: right pelvis pain    HPI:    11/16/2023 Patient is a 53 year old female with right pelvis pain. Patient states that she has had back pain for several months. Right sided low back pain. Decreased ROM. Pain when getting dressed has to sit to get dressed. She isn't able to run and all she wants to do is run. No meds. No numbness or tingling, no radiating pain. Is interested in OMT   12/21/2023 Patient states she has had some good improvement.    Relevant Historical Information: None pertinent  Additional pertinent review of systems negative.   Current Outpatient Medications:    acyclovir  (ZOVIRAX ) 200 MG capsule, Take 4 capsules twice a day for up to 5 days., Disp: 30 capsule, Rfl: 4   levonorgestrel  (MIRENA ) 20 MCG/24HR IUD, 1 each by Intrauterine route once., Disp: , Rfl:    meloxicam  (MOBIC ) 15 MG tablet, Take 1 tablet (15 mg total) by mouth daily., Disp: 30 tablet, Rfl: 0   methocarbamol  (ROBAXIN ) 500 MG tablet, Take 1 tablet (500 mg total) by mouth 3 (three) times daily as needed for muscle spasms., Disp: 90 tablet, Rfl: 1   estradiol  (VIVELLE -DOT) 0.05 MG/24HR patch, PLACE 1 PATCH (0.05 MG TOTAL) ONTO THE SKIN 2 (TWO) TIMES A WEEK., Disp: 8 patch, Rfl: 12   Objective:     Vitals:   12/21/23 1020  BP: 106/80  Pulse: 69  SpO2: 99%  Weight: 181 lb (82.1 kg)  Height: 5' 8 (1.727 m)      Body mass index is 27.52 kg/m.    Physical Exam:    General: Well-appearing, cooperative, sitting comfortably in no acute distress.   OMT Physical Exam:  ASIS Compression Test: Positive Right Right upper extremity: Restriction in external rotation and abduction.  Spencers technique performed Thoracic: TTP paraspinal Lumbar: TTP  paraspinal, L1-3 RRSL Pelvis: Right anterior innominate with out flare   Electronically signed by:  Odis Mace D.CLEMENTEEN [facility] 11:03 AM 12/21/23

## 2023-12-21 ENCOUNTER — Ambulatory Visit (INDEPENDENT_AMBULATORY_CARE_PROVIDER_SITE_OTHER): Payer: Commercial Managed Care - PPO | Admitting: Sports Medicine

## 2023-12-21 VITALS — BP 106/80 | HR 69 | Ht 68.0 in | Wt 181.0 lb

## 2023-12-21 DIAGNOSIS — G8929 Other chronic pain: Secondary | ICD-10-CM | POA: Diagnosis not present

## 2023-12-21 DIAGNOSIS — M778 Other enthesopathies, not elsewhere classified: Secondary | ICD-10-CM

## 2023-12-21 DIAGNOSIS — M25511 Pain in right shoulder: Secondary | ICD-10-CM | POA: Diagnosis not present

## 2023-12-21 DIAGNOSIS — M9907 Segmental and somatic dysfunction of upper extremity: Secondary | ICD-10-CM

## 2023-12-21 DIAGNOSIS — M9902 Segmental and somatic dysfunction of thoracic region: Secondary | ICD-10-CM

## 2023-12-21 DIAGNOSIS — M9903 Segmental and somatic dysfunction of lumbar region: Secondary | ICD-10-CM | POA: Diagnosis not present

## 2023-12-21 DIAGNOSIS — M9905 Segmental and somatic dysfunction of pelvic region: Secondary | ICD-10-CM | POA: Diagnosis not present

## 2023-12-21 DIAGNOSIS — M9904 Segmental and somatic dysfunction of sacral region: Secondary | ICD-10-CM | POA: Diagnosis not present

## 2023-12-21 DIAGNOSIS — M545 Low back pain, unspecified: Secondary | ICD-10-CM

## 2023-12-21 NOTE — Patient Instructions (Signed)
 Shoulder HEP  Use meloxicam as needed limit chronic NSAID to 1-2 times per week Continue physical activity as tolerated 4 week follow up
